# Patient Record
Sex: Female | Born: 1965 | Race: White | Hispanic: No | Marital: Married | Smoking: Never smoker
Health system: Southern US, Community
[De-identification: ages and names within clinical notes are randomized; demographics above are authoritative.]

## PROBLEM LIST (undated history)

## (undated) DIAGNOSIS — R0600 Dyspnea, unspecified: Secondary | ICD-10-CM

## (undated) DIAGNOSIS — T8859XA Other complications of anesthesia, initial encounter: Secondary | ICD-10-CM

## (undated) DIAGNOSIS — I509 Heart failure, unspecified: Secondary | ICD-10-CM

## (undated) DIAGNOSIS — R7303 Prediabetes: Secondary | ICD-10-CM

## (undated) DIAGNOSIS — I10 Essential (primary) hypertension: Secondary | ICD-10-CM

## (undated) DIAGNOSIS — J40 Bronchitis, not specified as acute or chronic: Secondary | ICD-10-CM

## (undated) DIAGNOSIS — F32A Depression, unspecified: Secondary | ICD-10-CM

## (undated) DIAGNOSIS — K219 Gastro-esophageal reflux disease without esophagitis: Secondary | ICD-10-CM

## (undated) DIAGNOSIS — E65 Localized adiposity: Secondary | ICD-10-CM

## (undated) DIAGNOSIS — R197 Diarrhea, unspecified: Secondary | ICD-10-CM

## (undated) DIAGNOSIS — E785 Hyperlipidemia, unspecified: Secondary | ICD-10-CM

## (undated) DIAGNOSIS — E039 Hypothyroidism, unspecified: Secondary | ICD-10-CM

## (undated) DIAGNOSIS — F419 Anxiety disorder, unspecified: Secondary | ICD-10-CM

## (undated) DIAGNOSIS — G479 Sleep disorder, unspecified: Secondary | ICD-10-CM

## (undated) HISTORY — PX: ABDOMINAL HYSTERECTOMY: SHX81

## (undated) HISTORY — PX: APPENDECTOMY: SHX54

## (undated) HISTORY — PX: OTHER SURGICAL HISTORY: SHX169

---

## 2005-03-05 DIAGNOSIS — I509 Heart failure, unspecified: Secondary | ICD-10-CM

## 2005-03-05 HISTORY — DX: Heart failure, unspecified: I50.9

## 2005-05-15 ENCOUNTER — Other Ambulatory Visit: Payer: Self-pay

## 2005-05-16 ENCOUNTER — Inpatient Hospital Stay: Payer: Self-pay | Admitting: Internal Medicine

## 2006-04-02 ENCOUNTER — Ambulatory Visit: Payer: Self-pay | Admitting: Obstetrics and Gynecology

## 2007-03-11 ENCOUNTER — Ambulatory Visit: Payer: Self-pay | Admitting: Nurse Practitioner

## 2007-04-16 ENCOUNTER — Ambulatory Visit: Payer: Self-pay | Admitting: Obstetrics and Gynecology

## 2007-04-22 ENCOUNTER — Ambulatory Visit: Payer: Self-pay | Admitting: Obstetrics and Gynecology

## 2007-04-30 ENCOUNTER — Ambulatory Visit: Payer: Self-pay | Admitting: Obstetrics and Gynecology

## 2007-04-30 ENCOUNTER — Other Ambulatory Visit: Payer: Self-pay

## 2007-05-05 ENCOUNTER — Inpatient Hospital Stay: Payer: Self-pay | Admitting: Obstetrics and Gynecology

## 2007-05-08 ENCOUNTER — Other Ambulatory Visit: Payer: Self-pay

## 2007-05-08 ENCOUNTER — Ambulatory Visit: Payer: Self-pay | Admitting: Obstetrics and Gynecology

## 2007-05-09 ENCOUNTER — Observation Stay: Payer: Self-pay | Admitting: Internal Medicine

## 2007-05-28 ENCOUNTER — Ambulatory Visit: Payer: Self-pay | Admitting: Cardiology

## 2008-01-04 HISTORY — PX: BREAST BIOPSY: SHX20

## 2008-04-27 ENCOUNTER — Ambulatory Visit: Payer: Self-pay | Admitting: Nurse Practitioner

## 2009-04-28 ENCOUNTER — Ambulatory Visit: Payer: Self-pay | Admitting: Nurse Practitioner

## 2010-06-13 ENCOUNTER — Ambulatory Visit: Payer: Self-pay | Admitting: Nurse Practitioner

## 2010-06-30 ENCOUNTER — Ambulatory Visit: Payer: Self-pay | Admitting: Nurse Practitioner

## 2011-06-26 ENCOUNTER — Ambulatory Visit: Payer: Self-pay | Admitting: Nurse Practitioner

## 2012-09-03 ENCOUNTER — Ambulatory Visit: Payer: Self-pay | Admitting: Nurse Practitioner

## 2013-09-28 ENCOUNTER — Ambulatory Visit: Payer: Self-pay | Admitting: Nurse Practitioner

## 2013-11-26 ENCOUNTER — Ambulatory Visit: Payer: Self-pay | Admitting: Family Medicine

## 2014-01-01 ENCOUNTER — Inpatient Hospital Stay: Payer: Self-pay | Admitting: Surgery

## 2014-01-01 LAB — CBC WITH DIFFERENTIAL/PLATELET
BASOS PCT: 0.4 %
Basophil #: 0.1 10*3/uL (ref 0.0–0.1)
EOS PCT: 0 %
Eosinophil #: 0 10*3/uL (ref 0.0–0.7)
HCT: 39.4 % (ref 35.0–47.0)
HGB: 13.2 g/dL (ref 12.0–16.0)
LYMPHS ABS: 2.1 10*3/uL (ref 1.0–3.6)
LYMPHS PCT: 7.7 %
MCH: 28.2 pg (ref 26.0–34.0)
MCHC: 33.4 g/dL (ref 32.0–36.0)
MCV: 84 fL (ref 80–100)
MONOS PCT: 6.2 %
Monocyte #: 1.7 x10 3/mm — ABNORMAL HIGH (ref 0.2–0.9)
NEUTROS ABS: 23.1 10*3/uL — AB (ref 1.4–6.5)
NEUTROS PCT: 85.7 %
Platelet: 284 10*3/uL (ref 150–440)
RBC: 4.66 10*6/uL (ref 3.80–5.20)
RDW: 13.2 % (ref 11.5–14.5)
WBC: 26.9 10*3/uL — AB (ref 3.6–11.0)

## 2014-01-01 LAB — COMPREHENSIVE METABOLIC PANEL
ALK PHOS: 89 U/L
Albumin: 3.7 g/dL (ref 3.4–5.0)
Anion Gap: 11 (ref 7–16)
BILIRUBIN TOTAL: 0.7 mg/dL (ref 0.2–1.0)
BUN: 10 mg/dL (ref 7–18)
CALCIUM: 9.7 mg/dL (ref 8.5–10.1)
Chloride: 103 mmol/L (ref 98–107)
Co2: 26 mmol/L (ref 21–32)
Creatinine: 0.81 mg/dL (ref 0.60–1.30)
EGFR (Non-African Amer.): >60
Glucose: 123 mg/dL — ABNORMAL HIGH (ref 65–99)
Osmolality: 280 (ref 275–301)
Potassium: 3.4 mmol/L — ABNORMAL LOW (ref 3.5–5.1)
SGOT(AST): 22 U/L (ref 15–37)
SGPT (ALT): 49 U/L
Sodium: 140 mmol/L (ref 136–145)
TOTAL PROTEIN: 7.4 g/dL (ref 6.4–8.2)

## 2014-01-01 LAB — URINALYSIS, COMPLETE
BLOOD: NEGATIVE
Bilirubin,UR: NEGATIVE
GLUCOSE, UR: NEGATIVE mg/dL (ref 0–75)
KETONE: NEGATIVE
Nitrite: NEGATIVE
Ph: 5 (ref 4.5–8.0)
Protein: 30
Specific Gravity: 1.031 (ref 1.003–1.030)
Squamous Epithelial: 19
WBC UR: 27 /HPF (ref 0–5)

## 2014-01-03 LAB — CBC WITH DIFFERENTIAL/PLATELET
Basophil #: 0.1 10*3/uL (ref 0.0–0.1)
Basophil %: 0.5 %
EOS ABS: 0.2 10*3/uL (ref 0.0–0.7)
Eosinophil %: 1.8 %
HCT: 31.9 % — AB (ref 35.0–47.0)
HGB: 10.6 g/dL — ABNORMAL LOW (ref 12.0–16.0)
Lymphocyte #: 1.8 10*3/uL (ref 1.0–3.6)
Lymphocyte %: 13.2 %
MCH: 28.4 pg (ref 26.0–34.0)
MCHC: 33.1 g/dL (ref 32.0–36.0)
MCV: 86 fL (ref 80–100)
Monocyte #: 0.7 x10 3/mm (ref 0.2–0.9)
Monocyte %: 5.3 %
NEUTROS ABS: 10.8 10*3/uL — AB (ref 1.4–6.5)
Neutrophil %: 79.2 %
PLATELETS: 186 10*3/uL (ref 150–440)
RBC: 3.72 10*6/uL — AB (ref 3.80–5.20)
RDW: 13.1 % (ref 11.5–14.5)
WBC: 13.6 10*3/uL — AB (ref 3.6–11.0)

## 2014-01-03 LAB — BASIC METABOLIC PANEL
ANION GAP: 6 — AB (ref 7–16)
BUN: 2 mg/dL — AB (ref 7–18)
Calcium, Total: 8.7 mg/dL (ref 8.5–10.1)
Chloride: 105 mmol/L (ref 98–107)
Co2: 31 mmol/L (ref 21–32)
Creatinine: 0.6 mg/dL (ref 0.60–1.30)
EGFR (African American): >60
EGFR (Non-African Amer.): >60
Glucose: 115 mg/dL — ABNORMAL HIGH (ref 65–99)
Osmolality: 280 (ref 275–301)
Potassium: 3.5 mmol/L (ref 3.5–5.1)
Sodium: 142 mmol/L (ref 136–145)

## 2014-01-04 LAB — BASIC METABOLIC PANEL
Anion Gap: 6 — ABNORMAL LOW (ref 7–16)
BUN: 3 mg/dL — ABNORMAL LOW (ref 7–18)
CALCIUM: 9 mg/dL (ref 8.5–10.1)
CHLORIDE: 104 mmol/L (ref 98–107)
CO2: 31 mmol/L (ref 21–32)
Creatinine: 0.68 mg/dL (ref 0.60–1.30)
GLUCOSE: 157 mg/dL — AB (ref 65–99)
Osmolality: 281 (ref 275–301)
POTASSIUM: 3.3 mmol/L — AB (ref 3.5–5.1)
SODIUM: 141 mmol/L (ref 136–145)

## 2014-01-04 LAB — CBC WITH DIFFERENTIAL/PLATELET
BASOS PCT: 0.1 %
Basophil #: 0 10*3/uL (ref 0.0–0.1)
EOS ABS: 0 10*3/uL (ref 0.0–0.7)
EOS PCT: 0 %
HCT: 30.9 % — AB (ref 35.0–47.0)
HGB: 10.4 g/dL — ABNORMAL LOW (ref 12.0–16.0)
Lymphocyte #: 1.3 10*3/uL (ref 1.0–3.6)
Lymphocyte %: 9.3 %
MCH: 28.6 pg (ref 26.0–34.0)
MCHC: 33.5 g/dL (ref 32.0–36.0)
MCV: 85 fL (ref 80–100)
MONO ABS: 0.6 x10 3/mm (ref 0.2–0.9)
Monocyte %: 4.3 %
NEUTROS PCT: 86.3 %
Neutrophil #: 12.3 10*3/uL — ABNORMAL HIGH (ref 1.4–6.5)
Platelet: 224 10*3/uL (ref 150–440)
RBC: 3.62 10*6/uL — ABNORMAL LOW (ref 3.80–5.20)
RDW: 12.9 % (ref 11.5–14.5)
WBC: 14.3 10*3/uL — ABNORMAL HIGH (ref 3.6–11.0)

## 2014-01-04 LAB — MAGNESIUM: MAGNESIUM: 2.1 mg/dL

## 2014-01-12 ENCOUNTER — Emergency Department: Payer: Self-pay | Admitting: Emergency Medicine

## 2014-01-12 LAB — CBC WITH DIFFERENTIAL/PLATELET
BASOS PCT: 1 %
Basophil #: 0.1 10*3/uL (ref 0.0–0.1)
EOS ABS: 0.3 10*3/uL (ref 0.0–0.7)
EOS PCT: 2.3 %
HCT: 41.6 % (ref 35.0–47.0)
HGB: 13.7 g/dL (ref 12.0–16.0)
Lymphocyte #: 3.1 10*3/uL (ref 1.0–3.6)
Lymphocyte %: 22.3 %
MCH: 28.3 pg (ref 26.0–34.0)
MCHC: 32.9 g/dL (ref 32.0–36.0)
MCV: 86 fL (ref 80–100)
MONO ABS: 0.6 x10 3/mm (ref 0.2–0.9)
Monocyte %: 4.1 %
NEUTROS ABS: 9.8 10*3/uL — AB (ref 1.4–6.5)
NEUTROS PCT: 70.3 %
Platelet: 382 10*3/uL (ref 150–440)
RBC: 4.84 10*6/uL (ref 3.80–5.20)
RDW: 13.1 % (ref 11.5–14.5)
WBC: 13.9 10*3/uL — AB (ref 3.6–11.0)

## 2014-01-12 LAB — COMPREHENSIVE METABOLIC PANEL
Albumin: 3.9 g/dL (ref 3.4–5.0)
Alkaline Phosphatase: 93 U/L
Anion Gap: 9 (ref 7–16)
BUN: 16 mg/dL (ref 7–18)
Bilirubin,Total: 0.4 mg/dL (ref 0.2–1.0)
CALCIUM: 9.7 mg/dL (ref 8.5–10.1)
Chloride: 106 mmol/L (ref 98–107)
Co2: 26 mmol/L (ref 21–32)
Creatinine: 0.94 mg/dL (ref 0.60–1.30)
Glucose: 145 mg/dL — ABNORMAL HIGH (ref 65–99)
OSMOLALITY: 285 (ref 275–301)
Potassium: 3.9 mmol/L (ref 3.5–5.1)
SGOT(AST): 53 U/L — ABNORMAL HIGH (ref 15–37)
SGPT (ALT): 104 U/L — ABNORMAL HIGH
Sodium: 141 mmol/L (ref 136–145)
Total Protein: 7.9 g/dL (ref 6.4–8.2)

## 2014-01-12 LAB — URINALYSIS, COMPLETE
BLOOD: NEGATIVE
Bacteria: NONE SEEN
Bilirubin,UR: NEGATIVE
Glucose,UR: NEGATIVE mg/dL (ref 0–75)
Ketone: NEGATIVE
Leukocyte Esterase: NEGATIVE
Nitrite: NEGATIVE
PH: 6 (ref 4.5–8.0)
PROTEIN: NEGATIVE
SPECIFIC GRAVITY: 1.021 (ref 1.003–1.030)
Squamous Epithelial: 1

## 2014-01-12 LAB — CLOSTRIDIUM DIFFICILE(ARMC)

## 2014-06-01 ENCOUNTER — Ambulatory Visit: Payer: Self-pay | Admitting: Nurse Practitioner

## 2014-06-18 ENCOUNTER — Other Ambulatory Visit: Payer: Self-pay | Admitting: Nurse Practitioner

## 2014-06-18 DIAGNOSIS — Z1231 Encounter for screening mammogram for malignant neoplasm of breast: Secondary | ICD-10-CM

## 2014-06-26 NOTE — Op Note (Signed)
PATIENT NAME:  Judy Burnett, LIBRIZZI MR#:  [id number] DATE OF BIRTH:  Sep 22, 1965  DATE OF PROCEDURE:  01/01/2014  PREOPERATIVE DIAGNOSIS: Acute appendicitis.   POSTOPERATIVE DIAGNOSES:  1.  Acute appendicitis, gangrenous and perforated.  2.  Intra-abdominal adhesions.   OPERATION PERFORMED: Laparoscopic lysis of adhesions and appendectomy.   SURGEON: Consuela Mimes, MD  ANESTHESIA: General.   PROCEDURE IN DETAIL: The patient was placed supine on the Operating Room table and prepped and draped in the usual sterile fashion. A Hassan cannula was introduced amidst horizontal mattress sutures of 0 Vicryl in the supraumbilical midline and a 15 mmHg CO2 pneumoperitoneum was created. The patient was noted to have multiple adhesions along the anterior abdominal wall, particularly in the midline between the umbilicus and the pelvis (from her previous hysterectomy). A 5 mm trocar was placed in the right lower quadrant and these adhesions were taken down mostly with the Harmonic scalpel, but there were 2 areas of terminal ileum that were adherent to the anterior abdominal wall and tedious sharp dissection was performed here taking care not to injure the full thickness of the intestinal wall. There were two 3 to 4 mm areas of deserosalization but the mucosa remained intact for both of these. Both of these areas were inspected very carefully with the laparoscope and a laparoscopic Kitner dissector and the suction irrigation device. Once these adhesions were taken down another 5 mm trocar was placed in the suprapubic midline and the appendectomy was undertaken. The appendix was very swollen and gangrenous and had perforated and there was a small amount of localized pus as well as a very small appendicolith present. This was all cleaned up with the suction irrigator and the mesoappendix was taken down with the Harmonic scalpel and the appendectomy was performed just where the appendix met the cecum at its base with  the Endo GIA stapling device. The appendix was placed in an Endo Catch bag and extracted from the abdomen via the supraumbilical port. The right lower quadrant was irrigated with copious amounts of warm normal saline and this was suctioned clear from the area as well as from the pelvis, and then the omentum (which had been adherent to the anterior abdominal wall) was draped over top of the operative area including the loop of terminal ileum. The peritoneum was desufflated and decannulated and the linea alba was closed with a single 0 PDS suture and the previously placed Vicryls were tied one to another. All 3 skin sites were closed with subcuticular 5-0 Monocryl and suture strips. The patient tolerated the procedure well. There were no complications.     ____________________________ Consuela Mimes, MD wfm:ts D: 01/01/2014 23:02:07 ET T: 01/02/2014 03:15:12 ET JOB#: [id number]  cc: Consuela Mimes, MD, <Dictator> Consuela Mimes MD ELECTRONICALLY SIGNED 01/03/2014 3:07

## 2014-06-26 NOTE — Discharge Summary (Signed)
PATIENT NAME:  Judy Burnett, Judy Burnett MR#:  [id number] DATE OF BIRTH:  1966-01-05  DATE OF ADMISSION:  01/01/2014 DATE OF DISCHARGE:  01/04/2014  PRINCIPLE DIAGNOSIS: Gangrenous perforated acute appendicitis.   OTHER DIAGNOSES: Hypertension, dyslipidemia, anxiety/depression, status post hysterectomy.   PRINCIPLE PROCEDURES PERFORMED DURING THIS ADMISSION: On 10/30 were laparoscopic lysis of adhesions and appendectomy.   HOSPITAL COURSE: The patient underwent the above-mentioned procedure for the above-mentioned diagnosis, and she was discharged home on November 2 after she was tolerating a diet and afebrile. Her medications at the time of discharge were unchanged from her prehospital medication regimen with the exception of Norco 5/325 mg 1 to 2 q. 4-6h. Burnett.r.n. pain. She was asked to make an appointment to see me in a week or 2 and call the office in the interim for any problems.    ____________________________ Claude MangesWilliam F. Lakeena Downie, MD wfm:jh D: 01/11/2014 11:52:11 ET T: 01/11/2014 13:09:00 ET JOB#: [id number]  cc: Claude MangesWilliam F. Ila Landowski, MD, <Dictator> Claude MangesWILLIAM F Zyrus Hetland MD ELECTRONICALLY SIGNED 01/15/2014 9:20

## 2014-06-26 NOTE — H&P (Signed)
History of Present Illness 5 yof who started having nausea, vomiting and RLQ abdominal pain yesterday and through the night. She was unable to sleep. The pain got very devere at about 2:30 AM, and was associated with chills. Had a RUQ U/S a few weeks ago which apparently was negative for GSs. No GU Sx.   Past Medical Health Hypertension, Other, Dyslipidemia   Code Status Full Code   Past Med/Surgical Hx:  Anxiety:   Depression:   Hysterectomy - Total:   ALLERGIES:  No Known Allergies:   HOME MEDICATIONS: Medication Instructions Status  Lexapro 10 mg oral tablet 1 tab(s) orally once a day Active  hydrochlorothiazide 25 mg oral tablet 1 tab(s) orally once a day Active  Crestor 5 mg oral tablet 1 tab(s) orally once a day (in the morning) Active  multivitamin 1 tab(s) orally once a day Active  omeprazole 20 mg oral delayed release capsule 1 cap(s) orally once a day, As Needed - for Indigestion, Heartburn Active   Family and Social History:  Family History Non-Contributory   Social History negative tobacco, negative ETOH, married, lives with husband and two sons (25 and 101); HS history Software engineer of [employer]   Review of Systems:  Fever/Chills Yes   Cough No   Sputum No   Abdominal Pain Yes   Diarrhea No   Constipation No   Nausea/Vomiting Yes   SOB/DOE No   Chest Pain No   Dysuria No   Tolerating PT Yes   Tolerating Diet No  Nauseated  Vomiting   Medications/Allergies Reviewed Medications/Allergies reviewed   Physical Exam:  GEN well developed, well nourished, no acute distress, obese   HEENT pink conjunctivae, PERRL, hearing intact to voice, moist oral mucosa, Oropharynx clear   NECK supple  trachea midline   RESP normal resp effort  clear BS  no use of accessory muscles   CARD regular rate  no murmur  no thrills  no JVD  no Rub   ABD positive tenderness  nondistended, exquisite rebound tenderness with guarding in RLQ   LYMPH negative  neck   EXTR negative cyanosis/clubbing, negative edema   SKIN normal to palpation, No rashes, No ulcers, skin turgor good   NEURO cranial nerves intact, negative tremor, follows commands, motor/sensory function intact   PSYCH alert, A+O to time, place, person, good insight   Lab Results: Hepatic:  30-Oct-15 15:07   Bilirubin, Total 0.7  Alkaline Phosphatase 89 (46-116 NOTE: New Reference Range 09/22/13)  SGPT (ALT) 49 (14-63 NOTE: New Reference Range 09/22/13)  SGOT (AST) 22  Total Protein, Serum 7.4  Albumin, Serum 3.7  Routine Chem:  30-Oct-15 15:07   Glucose, Serum  123  BUN 10  Creatinine (comp) 0.81  Sodium, Serum 140  Potassium, Serum  3.4  Chloride, Serum 103  CO2, Serum 26  Calcium (Total), Serum 9.7  Osmolality (calc) 280  eGFR (African American) >60  eGFR (Non-African American) >60 (eGFR values <29m/min/1.73 m2 may be an indication of chronic kidney disease (CKD). Calculated eGFR, using the MRDR Study equation, is useful in  patients with stable renal function. The eGFR calculation will not be reliable in acutely ill patients when serum creatinine is changing rapidly. It is not useful in patients on dialysis. The eGFR calculation may not be applicable to patients at the low and high extremes of body sizes, pregnant women, and vegetarians.)  Anion Gap 11  Routine UA:  30-Oct-15 15:07   Color (UA) Yellow  Clarity (UA) Turbid  Glucose (UA) Negative  Bilirubin (UA) Negative  Ketones (UA) Negative  Specific Gravity (UA) 1.031  Blood (UA) Negative  pH (UA) 5.0  Protein (UA) 30 mg/dL  Nitrite (UA) Negative  Leukocyte Esterase (UA) 1+ (Result(s) reported on 01 Jan 2014 at 04:54PM.)  RBC (UA) 10 /HPF  WBC (UA) 27 /HPF  Bacteria (UA) TRACE  Epithelial Cells (UA) 19 /HPF  Mucous (UA) PRESENT  Amorphous Crystal (UA) PRESENT (Result(s) reported on 01 Jan 2014 at 04:54PM.)  Routine Hem:  30-Oct-15 15:07   WBC (CBC)  26.9  RBC (CBC) 4.66  Hemoglobin  (CBC) 13.2  Hematocrit (CBC) 39.4  Platelet Count (CBC) 284  MCV 84  MCH 28.2  MCHC 33.4  RDW 13.2  Neutrophil % 85.7  Lymphocyte % 7.7  Monocyte % 6.2  Eosinophil % 0.0  Basophil % 0.4  Neutrophil #  23.1  Lymphocyte # 2.1  Monocyte #  1.7  Eosinophil # 0.0  Basophil # 0.1 (Result(s) reported on 01 Jan 2014 at 03:37PM.)   Radiology Results: XRay:    30-Oct-15 17:25, Chest 1 View AP or PA  Chest 1 View AP or PA  REASON FOR EXAM:    cough  COMMENTS:   May transport without cardiac monitor    PROCEDURE: DXR - DXR CHEST 1 VIEWAP OR PA  - Jan 01 2014  5:25PM     CLINICAL DATA:  Right-sided abdominal pain.  Vomiting.  Diarrhea.    EXAM:  CHEST - 1 VIEW    COMPARISON:  None.    FINDINGS:  The heart size and mediastinal contours are within normal limits.  Both lungs are clear. The visualized skeletal structures are  unremarkable.     IMPRESSION:  No active disease.      Electronically Signed    By: Earle Gell M.D.    On: 01/01/2014 17:27         Verified By: Marlaine Hind, M.D.,  [place]:  PACS Image    30-Oct-15 18:18, CT Abdomen and Pelvis With Contrast  PACS Image  CT:  CT Abdomen and Pelvis With Contrast  REASON FOR EXAM:    (1) rlq pain, tender, fever, wbc 26; (2) rlq pain,   tender, fever, wbc 26  COMMENTS:   May transport without cardiac monitor    PROCEDURE: CT  - CT ABDOMEN / PELVIS  W  - Jan 01 2014  6:18PM     CLINICAL DATA:  Right lower quadrant pain .    EXAM:  CT ABDOMEN AND PELVIS WITH CONTRAST    TECHNIQUE:  Multidetector CT imaging of the abdomen and pelvis was performed  using the standard protocol following bolus administration of  intravenous contrast.  CONTRAST:  100 cc Isovue-300.    COMPARISON:  None.    FINDINGS:  Diffuse fatty infiltration of the liver. Spleen is normal. Pancreas  is normal. No biliary distention. Gallbladder is nondistended.    Adrenals are normal. Kidneys are normal. Bladder is nondistended.  No  pelvic mass.    No significant adenopathy. Abdominal aorta is normal in appearance.  Visceral vessels and portal vein patent.    The appendix is distended 1.7 cm. Probable appendicoliths are  present. Severe periappendiceal edema noted. These findings are  consistent with appendicitis. No evidence of bowel obstruction. No  free air. Stomach is nondistended. No mesenteric mass. No  significant hernia.    Heart size normal. Lung bases are clear. No acute bony abnormality.  IMPRESSION:  Severe changes of appendicitis. These results were called by  telephone at the time of interpretation on 01/01/2014 at 6:52 pm to  Dr. Carrie Mew , who verbally acknowledged these results.      Electronically Signed    By: Marcello Moores  Register    On: 01/01/2014 18:54        Verified By: Osa Craver, M.D., MD    Assessment/Admission Diagnosis Acute appendicitis   Plan IVF, IV ABx, Lap appy Likely operative and postoperative course, risks, recovery discussed and pt agrees to proceed.   Electronic Signatures: Consuela Mimes (MD)  (Signed 30-Oct-15 20:11)  Authored: CHIEF COMPLAINT and HISTORY, PAST MEDICAL/SURGIAL HISTORY, ALLERGIES, HOME MEDICATIONS, FAMILY AND SOCIAL HISTORY, REVIEW OF SYSTEMS, PHYSICAL EXAM, LABS, Radiology, ASSESSMENT AND PLAN   Last Updated: 30-Oct-15 20:11 by Consuela Mimes (MD)

## 2014-06-26 NOTE — Consult Note (Signed)
PATIENT NAME:  Judy AmesJENKINS, Malaisha P MR#:  [id number] DATE OF BIRTH:  20-May-1965  DATE OF CONSULTATION:  01/03/2014  REFERRING PHYSICIAN:  Loraine LericheMark A. Egbert GaribaldiBird, MD CONSULTING PHYSICIAN:  Duane LopeJeffrey D. Judithann SheenSparks, MD  FAMILY PHYSICIAN: Nonlocal.   REASON FOR CONSULTATION: Shortness of breath with hypoxia.   HISTORY OF PRESENT ILLNESS: The patient is a 49 year old female with a significant history of hypertension, hyperlipidemia and anxiety/depression who presented to the Emergency Room with abdominal pain and was found to have acute appendicitis. She was admitted and underwent appendectomy. Yesterday afternoon, the patient developed cough, wheezing and shortness of breath. She became mildly hypoxic. Consultation was subsequently requested. Denies any history of asthma or COPD. She is not a smoker. No cardiac history.   PAST MEDICAL HISTORY:  1.  Benign hypertension.  2.  Hyperlipidemia.  3.  GE reflux disease.  4.  Anxiety/depression.  5.  Status post hysterectomy.   MEDICATIONS:  1.  Omeprazole 20 mg p.o. daily.  2.  Lexapro 10 mg p.o. daily.  3.  Hydrochlorothiazide 25 mg p.o. daily.  4.  Crestor 5 mg p.o. daily.   ALLERGIES: No known drug allergies.   SOCIAL HISTORY: Negative for alcohol or tobacco abuse.   FAMILY HISTORY: Positive for hypertension, diabetes, stroke, but negative for coronary artery disease.   REVIEW OF SYSTEMS: CONSTITUTIONAL: No fever or change in weight.  EYES: No blurred or double vision. No glaucoma.  EARS, NOSE AND THROAT: No tinnitus or hearing loss. No nasal discharge or bleeding. Some difficulty swallowing post intubation for surgery.  GASTROINTESTINAL: No nausea, vomiting or diarrhea. Some abdominal pain postoperatively.  GENITOURINARY: No dysuria or hematuria. No incontinence.  ENDOCRINE: No polyuria or polydipsia. No heat or cold intolerance.  CARDIOVASCULAR: Denies chest pain or palpitations. No orthopnea.  PULMONARY: Some cough. Denies hemoptysis. No painful  respiration.  HEMATOLOGIC: The patient denies anemia, bruising or easy bleeding.  LYMPHATIC: No swollen glands.  MUSCULOSKELETAL: The patient denies pain in her neck, back, shoulders, knees, hips. No gout.  NEUROLOGIC: No numbness or migraines. Denies stroke or seizures.  PSYCHOLOGICAL: The patient denies depression, although she does admit to some anxiety.   PHYSICAL EXAMINATION:  GENERAL: The patient is in no acute distress.  VITAL SIGNS: Currently remarkable for a blood pressure of 135/82, heart rate 86, respiratory rate of 22, temperature 98.6, saturation 95% on 2 liters.  HEENT: Normocephalic, atraumatic. Pupils show to be equal, round and reactive to light and accommodation. Extraocular movements are intact. Sclerae are not icteric. Conjunctivae are clear. Oropharynx is clear. NECK: Supple without JVD. No adenopathy or thyromegaly is noted.  LUNGS: Reveal mild wheezing in the upper airways. There are some scattered rhonchi. No dullness or rales. Respiratory effort is normal.  CARDIAC: Regular rate and rhythm with a normal S1, S2. No significant rubs, murmurs or gallops. PMI is nondisplaced. Chest wall is nontender.  ABDOMEN: Mildly tender but soft. Normoactive bowel sounds. No organomegaly or masses were appreciated. No hernias or bruits were noted.  EXTREMITIES: Without clubbing, cyanosis or edema. Pulses were 2+ bilaterally.  SKIN: Warm and dry without rash or lesions.  NEUROLOGIC: Revealed cranial nerves II-XII grossly intact. Deep tendon reflexes were symmetric. Motor and sensory examinations nonfocal.  PSYCHIATRIC: Revealed a patient who is alert and oriented to person, place and time. She was cooperative and used good judgment.   LABORATORY DATA: White count was 13.6 with a hemoglobin of 10.6. Glucose 115 with a BUN of 2, creatinine of 0.60. GFR of greater than  60.   ASSESSMENT:  1.  Postoperative bronchospasm, questionably related to inflammation post intubation.  2.  Benign  hypertension.  3.  Hyperlipidemia.  4.  Anxiety/depression.  5.  Status post appendectomy.   PLAN: CT scan of the chest to rule out PE has been ordered. We will give a one-time dose of IV Solu-Medrol. We will begin SVNs q. 4 hours while awake and Symbicort 2 puffs b.i.d. Wean oxygen as tolerated. Follow up laboratories in the morning.   Thank you for the consultation. We will continue to follow this patient with you while in the hospital. Please call if questions arise.   TOTAL TIME SPENT ON THIS PATIENT: Forty-five minutes.    ____________________________ Duane Lope Judithann Sheen, MD jds:TT D: 01/03/2014 11:29:26 ET T: 01/03/2014 16:15:22 ET JOB#: [id number]  cc: Duane Lope. Judithann Sheen, MD, <Dictator> Jonnae Fonseca Rodena Medin MD ELECTRONICALLY SIGNED 01/03/2014 17:14

## 2014-06-26 NOTE — Consult Note (Signed)
Chief Complaint:  Subjective/Chief Complaint Pt post-op from appy now with SOB, cough, wheezing, and mild hypoxia. No fever. No hx of COPD/asthma or cardiac dz.   VITAL SIGNS/ANCILLARY NOTES: **Vital Signs.:   01-Nov-15 04:25  Temperature Temperature (F) 97.8  Pulse Pulse 79  Respirations Respirations 22  Systolic BP Systolic BP 892  Diastolic BP (mmHg) Diastolic BP (mmHg) 75  Pulse Ox % Pulse Ox % 97  Oxygen Delivery 2L    06:20  Temperature Temperature (F) 98.5  Pulse Pulse 74  Systolic BP Systolic BP 97  Diastolic BP (mmHg) Diastolic BP (mmHg) 60  Pulse Ox % Pulse Ox % 97    07:49  Temperature Temperature (F) 98.6  Pulse Pulse 86  Respirations Respirations 22  Systolic BP Systolic BP 119  Diastolic BP (mmHg) Diastolic BP (mmHg) 82  Pulse Ox % Pulse Ox % 95  Oxygen Delivery 2L   Brief Assessment:  GEN no acute distress   Cardiac Regular   Respiratory wheezing  rhonchi   Gastrointestinal details normal Soft  Bowel sounds normal   EXTR negative edema   Lab Results: Routine Chem:  30-Oct-15 15:07   Glucose, Serum  123  BUN 10  Creatinine (comp) 0.81  Sodium, Serum 140  Potassium, Serum  3.4  Chloride, Serum 103  CO2, Serum 26  Anion Gap 11  eGFR (Non-African American) >60 (eGFR values <48m/min/1.73 m2 may be an indication of chronic kidney disease (CKD). Calculated eGFR, using the MRDR Study equation, is useful in  patients with stable renal function. The eGFR calculation will not be reliable in acutely ill patients when serum creatinine is changing rapidly. It is not useful in patients on dialysis. The eGFR calculation may not be applicable to patients at the low and high extremes of body sizes, pregnant women, and vegetarians.)  01-Nov-15 08:29   Glucose, Serum  115  BUN  2  Creatinine (comp) 0.60  Sodium, Serum 142  Potassium, Serum 3.5  Chloride, Serum 105  CO2, Serum 31  Anion Gap  6  eGFR (Non-African American) >60 (eGFR values  <669mmin/1.73 m2 may be an indication of chronic kidney disease (CKD). Calculated eGFR, using the MRDR Study equation, is useful in  patients with stable renal function. The eGFR calculation will not be reliable in acutely ill patients when serum creatinine is changing rapidly. It is not useful in patients on dialysis. The eGFR calculation may not be applicable to patients at the low and high extremes of body sizes, pregnant women, and vegetarians.)  Routine Hem:  30-Oct-15 15:07   WBC (CBC)  26.9  Hemoglobin (CBC) 13.2  Platelet Count (CBC) 284  01-Nov-15 08:29   WBC (CBC)  13.6  Hemoglobin (CBC)  10.6  Platelet Count (CBC) 186   Radiology Results: XRay:    30-Oct-15 17:25, Chest 1 View AP or PA  Chest 1 View AP or PA   REASON FOR EXAM:    cough  COMMENTS:   May transport without cardiac monitor    PROCEDURE: DXR - DXR CHEST 1 VIEWAP OR PA  - Jan 01 2014  5:25PM     CLINICAL DATA:  Right-sided abdominal pain.  Vomiting.  Diarrhea.    EXAM:  CHEST - 1 VIEW    COMPARISON:  None.    FINDINGS:  The heart size and mediastinal contours are within normal limits.  Both lungs are clear. The visualized skeletal structures are  unremarkable.     IMPRESSION:  No active disease.  Electronically Signed    By: Earle Gell M.D.    On: 01/01/2014 17:27         Verified By: Marlaine Hind, M.D.,   Assessment/Plan:  Assessment/Plan:  Plan Will use SVN's q4h WA and add Symbicort. 1 time dose of IV steroids. CT chest pending. Wean O2 as toelrated. Will follow with you.   Electronic Signatures: Idelle Crouch (MD)  (Signed [id number] 11:23)  Authored: Chief Complaint, VITAL SIGNS/ANCILLARY NOTES, Brief Assessment, Lab Results, Radiology Results, Assessment/Plan   Last Updated: 01-Nov-15 11:23 by Idelle Crouch (MD)

## 2014-06-26 NOTE — Consult Note (Signed)
PATIENT NAME:  Judy Burnett, Judy Burnett MR#:  [id number] DATE OF BIRTH:  [birth year]  DATE OF CONSULTATION:  01/12/2014  REFERRING PHYSICIAN:   CONSULTING PHYSICIAN:  Cristal Deerhristopher A. Cullin Dishman, MD  ATTENDING PHYSICIAN:  Ida Roguehristopher Mersades Barbaro, MD.    REASON FOR CONSULTATION: Drainage from wound, diarrhea, and postoperative abdominal pain.   HISTORY OF PRESENT ILLNESS: [name] is a pleasant 49 year old female with history of hypertension, hyperlipidemia, who recently had a laparoscopic appendectomy and drainage of intra-abdominal abscess performed on October 30. She was discharged on November 2 taking good Burnett.o. with good Burnett.o. pain control, voiding and stooling without difficulties. She presents with drainage from her abdominal incision she says since the last three days. She has had diarrhea. She also complains of having a kind of diffuse pain, does not feel like she is returning back to baseline. Otherwise no fevers, chills, night sweats, shortness of breath, cough, chest pain, nausea, vomiting, constipation, dysuria, or hematuria.   HOME MEDICATIONS:  Omeprazole 20 mg Burnett.o. daily, multivitamin 1 tab Burnett.o. daily, Lexapro 10 mg Burnett.o. daily, hydrochlorothiazide 125 mg Burnett.o. daily, Crestor 5 mg Burnett.o. daily, Norco 1 tab Burnett.o. q. 4 hours Burnett.r.n. pain.   ALLERGIES: No known drug allergies.   FAMILY HISTORY: Noncontributory.   SOCIAL HISTORY: Denies tobacco, alcohol. Is a high Engineer, siteschool teacher.   REVIEW OF SYSTEMS: A 12 point review of systems review of systems obtained, pertinent positives and negatives as above.   PHYSICAL EXAMINATION:  VITAL SIGNS: Temperature 97.9, pulse 99, blood pressure 121/77, respirations 18,  97% on room air.  GENERAL: No acute distress. Alert and oriented x 2.  HEAD: Normocephalic, atraumatic.  EYES: No scleral icterus. No conjunctivitis.  FACE: No obvious facial trauma. Normal external nose. Normal external ears.  CHEST: Lungs clear to auscultation. Moving air well.  HEART:  Regular rate and rhythm. No murmurs, rubs, or gallops.  ABDOMEN: Soft, diffusely tender, has midline incision which is draining some sanguinopurulent drainage, but appears to be draining no obvious abscess.  EXTREMITIES: Moves all extremities well. Strength 5 out of 5.  NEUROLOGIC: Cranial nerves II through XII grossly intact. Sensation intact in all 4 extremities.   LABORATORY AND RADIOLOGICAL DATA: Pending.   IMAGING: A CT scan pending.   ASSESSMENT AND PLAN: [name] is a pleasant 49 year old status post laparoscopic appendectomy for ruptured and gangrenous appendicitis with abscess.  Due to her diffuse pain would consider a CT scan if laboratories are abnormal, also would recommend antibiotics for draining wound. In addition would consider testing for Clostridium difficile due to the fact that had diarrhea for a few days. We will follow up on CT scan results.     ____________________________ Si Raiderhristopher A. Truett Mcfarlan, MD cal:bu D: 01/12/2014 16:06:46 ET T: 01/12/2014 17:32:34 ET JOB#: [id number]  cc: Cristal Deerhristopher A. Claryce Friel, MD, <Dictator> Jarvis NewcomerHRISTOPHER A Laird Runnion MD ELECTRONICALLY SIGNED 01/17/2014 19:33

## 2014-06-28 LAB — SURGICAL PATHOLOGY

## 2014-09-30 ENCOUNTER — Ambulatory Visit
Admission: RE | Admit: 2014-09-30 | Discharge: 2014-09-30 | Disposition: A | Discharge status: Home / Self Care | Payer: BC Managed Care – PPO | Source: Ambulatory Visit | Attending: Nurse Practitioner | Admitting: Nurse Practitioner

## 2014-09-30 DIAGNOSIS — Z1231 Encounter for screening mammogram for malignant neoplasm of breast: Secondary | ICD-10-CM | POA: Insufficient documentation

## 2014-10-01 ENCOUNTER — Other Ambulatory Visit: Payer: Self-pay | Admitting: Nurse Practitioner

## 2014-10-01 DIAGNOSIS — E049 Nontoxic goiter, unspecified: Secondary | ICD-10-CM

## 2014-10-06 ENCOUNTER — Ambulatory Visit
Admission: RE | Admit: 2014-10-06 | Discharge: 2014-10-06 | Disposition: A | Discharge status: Home / Self Care | Payer: BC Managed Care – PPO | Source: Ambulatory Visit | Attending: Nurse Practitioner | Admitting: Nurse Practitioner

## 2014-10-06 DIAGNOSIS — E049 Nontoxic goiter, unspecified: Secondary | ICD-10-CM

## 2014-10-08 ENCOUNTER — Other Ambulatory Visit: Payer: Self-pay

## 2014-10-08 ENCOUNTER — Emergency Department
Admission: EM | Admit: 2014-10-08 | Discharge: 2014-10-08 | Disposition: A | Discharge status: Home / Self Care | Payer: BC Managed Care – PPO | Attending: Emergency Medicine | Admitting: Emergency Medicine

## 2014-10-08 ENCOUNTER — Emergency Department: Payer: BC Managed Care – PPO

## 2014-10-08 ENCOUNTER — Encounter: Payer: Self-pay | Admitting: Emergency Medicine

## 2014-10-08 DIAGNOSIS — R079 Chest pain, unspecified: Secondary | ICD-10-CM | POA: Diagnosis present

## 2014-10-08 DIAGNOSIS — R1013 Epigastric pain: Secondary | ICD-10-CM | POA: Diagnosis not present

## 2014-10-08 DIAGNOSIS — Z79899 Other long term (current) drug therapy: Secondary | ICD-10-CM | POA: Diagnosis not present

## 2014-10-08 DIAGNOSIS — R0602 Shortness of breath: Secondary | ICD-10-CM | POA: Diagnosis not present

## 2014-10-08 HISTORY — DX: Heart failure, unspecified: I50.9

## 2014-10-08 LAB — BASIC METABOLIC PANEL
Anion gap: 10 (ref 5–15)
BUN: 14 mg/dL (ref 6–20)
CALCIUM: 9.9 mg/dL (ref 8.9–10.3)
CO2: 27 mmol/L (ref 22–32)
Chloride: 99 mmol/L — ABNORMAL LOW (ref 101–111)
Creatinine, Ser: 0.58 mg/dL (ref 0.44–1.00)
GFR calc Af Amer: >60 mL/min (ref >60)
GLUCOSE: 119 mg/dL — AB (ref 65–99)
POTASSIUM: 3.2 mmol/L — AB (ref 3.5–5.1)
SODIUM: 136 mmol/L (ref 135–145)

## 2014-10-08 LAB — TROPONIN I: Troponin I: <0.03 ng/mL (ref <0.031)

## 2014-10-08 LAB — CBC
HCT: 40 % (ref 35.0–47.0)
Hemoglobin: 14.1 g/dL (ref 12.0–16.0)
MCH: 29.1 pg (ref 26.0–34.0)
MCHC: 35.3 g/dL (ref 32.0–36.0)
MCV: 82.3 fL (ref 80.0–100.0)
Platelets: 230 10*3/uL (ref 150–440)
RBC: 4.86 MIL/uL (ref 3.80–5.20)
RDW: 13 % (ref 11.5–14.5)
WBC: 7.6 10*3/uL (ref 3.6–11.0)

## 2014-10-08 LAB — URINALYSIS COMPLETE WITH MICROSCOPIC (ARMC ONLY)
Bacteria, UA: NONE SEEN
Bilirubin Urine: NEGATIVE
Glucose, UA: NEGATIVE mg/dL
Hgb urine dipstick: NEGATIVE
Ketones, ur: NEGATIVE mg/dL
Leukocytes, UA: NEGATIVE
Nitrite: NEGATIVE
Protein, ur: NEGATIVE mg/dL
Specific Gravity, Urine: 1.018 (ref 1.005–1.030)
Squamous Epithelial / HPF: NONE SEEN
pH: 6 (ref 5.0–8.0)

## 2014-10-08 LAB — HEPATIC FUNCTION PANEL
ALT: 47 U/L (ref 14–54)
AST: 37 U/L (ref 15–41)
Albumin: 4.5 g/dL (ref 3.5–5.0)
Alkaline Phosphatase: 85 U/L (ref 38–126)
TOTAL PROTEIN: 7.6 g/dL (ref 6.5–8.1)
Total Bilirubin: 0.4 mg/dL (ref 0.3–1.2)

## 2014-10-08 LAB — LIPASE, BLOOD: Lipase: 37 U/L (ref 22–51)

## 2014-10-08 MED ORDER — ONDANSETRON 4 MG PO TBDP
4.0000 mg | ORAL_TABLET | Freq: Once | ORAL | Status: AC
  Administered 2014-10-08: 4 mg via ORAL
  Filled 2014-10-08: qty 1

## 2014-10-08 MED ORDER — GI COCKTAIL ~~LOC~~
30.0000 mL | Freq: Once | ORAL | Status: AC
  Administered 2014-10-08: 30 mL via ORAL
  Filled 2014-10-08: qty 30

## 2014-10-08 NOTE — ED Notes (Signed)
Pt made aware of need for urine sample.  

## 2014-10-08 NOTE — ED Notes (Signed)
Pt reports that she was nauseous yesterday and that she began to feel sharp pains in her chest area. She also states that she was "unusually sweaty" last night and that she feels "full" and like she has indigestion. Pt reports that her pain has settled into the area between her shoulder blades in her upper back. She is alert & oriented with warm, dry skin. NAD noted.

## 2014-10-08 NOTE — ED Notes (Signed)
Patient to ER for chest pain that began yesterday. Reports episode of nausea with diaphoresis yesterday. States she feels like she "has indigestion", reports back pain today.

## 2014-10-08 NOTE — ED Provider Notes (Signed)
[facility] Emergency Department Provider Note   ____________________________________________  Time seen: 4 PM I have reviewed the triage vital signs and the triage nursing note.  HISTORY  Chief Complaint Chest Pain   Historian Patient  HPI Judy Burnett is a 49 y.o. female who is a 24 hours of midepigastric pain which goes through to her back. She's also had a little bit shortness of breath, and a little bit of indigestion. She is unsure if this may be related to her gallbladder. She's had similar symptoms in the past that her doctor thought it might be related to Doppler, however she had a negative ultrasound for any gallstones and a negative HIDA scan in the past. She's had no vomiting or diarrhea. Chest discomfort at the sternal area. Due to her belly feels "full."No recent fevers or coughing. No constipation.    Past Medical History  Diagnosis Date  . CHF (congestive heart failure)     There are no active problems to display for this patient.   Past Surgical History  Procedure Laterality Date  . Breast biopsy Left 01/2008    neg  . Abdominal hysterectomy    . Appendectomy      Current Outpatient Rx  Name  Route  Sig  Dispense  Refill  . citalopram (CELEXA) 40 MG tablet   Oral   Take 40 mg by mouth at bedtime.         . hydrochlorothiazide (HYDRODIURIL) 25 MG tablet   Oral   Take 25 mg by mouth at bedtime.         Marland Kitchen levothyroxine (SYNTHROID, LEVOTHROID) 50 MCG tablet   Oral   Take 50 mcg by mouth daily before breakfast.         . Multiple Vitamins-Minerals (MULTIVITAMIN PO)   Oral   Take 1 tablet by mouth at bedtime.         . rosuvastatin (CRESTOR) 20 MG tablet   Oral   Take 20 mg by mouth at bedtime.           Allergies Review of patient's allergies indicates no known allergies.  No family history on file.  Social History History  Substance Use Topics  . Smoking status: Never Smoker   . Smokeless tobacco:  Not on file  . Alcohol Use: No    Review of Systems  Constitutional: Negative for fever. Eyes: Negative for visual changes. ENT: Negative for sore throat. Cardiovascular: Negative for chest pain. Respiratory: Negative for cough Gastrointestinal: Negative for vomiting and diarrhea. Genitourinary: Negative for dysuria. Musculoskeletal: Negative for extremity pain.. Skin: Negative for rash. Neurological: Negative for headaches, focal weakness or numbness. 10 point Review of Systems otherwise negative ____________________________________________   PHYSICAL EXAM:  VITAL SIGNS: ED Triage Vitals  Enc Vitals Group     BP 10/08/14 1414 128/73 mmHg     Pulse Rate 10/08/14 1414 86     Resp 10/08/14 1414 18     Temp 10/08/14 1414 98.8 F (37.1 C)     Temp Source 10/08/14 1414 Oral     SpO2 10/08/14 1414 99 %     Weight 10/08/14 1414 190 lb (86.183 kg)     Height 10/08/14 1414 5\' 4"  (1.626 m)     Head Cir --      Peak Flow --      Pain Score 10/08/14 1415 5     Pain Loc --      Pain Edu? --  Excl. in GC? --      Constitutional: Alert and oriented. Well appearing and in no distress. Eyes: Conjunctivae are normal. PERRL. Normal extraocular movements. ENT   Head: Normocephalic and atraumatic.   Nose: No congestion/rhinnorhea.   Mouth/Throat: Mucous membranes are moist.   Neck: No stridor. Cardiovascular/Chest: Normal rate, regular rhythm.  No murmurs, rubs, or gallops. Respiratory: Normal respiratory effort without tachypnea nor retractions. Breath sounds are clear and equal bilaterally. No wheezes/rales/rhonchi. Gastrointestinal: Soft. No distention, no guarding, no rebound. Mild tenderness in the epigastrium.  Genitourinary/rectal:Deferred Musculoskeletal: Nontender with normal range of motion in all extremities. No joint effusions.  No lower extremity tenderness nor edema. Neurologic:  Normal speech and language. No gross or focal neurologic deficits are  appreciated. Skin:  Skin is warm, dry and intact. No rash noted. Psychiatric: Mood and affect are normal. Speech and behavior are normal. Patient exhibits appropriate insight and judgment.  ____________________________________________   EKG I, Governor Rooks, MD, the attending physician have personally viewed and interpreted all ECGs.  77 bpm. Normal sinus rhythm. Narrow QRS. Normal axis. Nonspecific T wave. ____________________________________________  LABS (pertinent positives/negatives)  Metabolic panel significant only for potassium 3.2 otherwise no significant abnormality White blood cell count 7.6, hemoglobin 41 Troponin less than 0.03 Hepatic function panel within normal limits Lipase 37  ____________________________________________  RADIOLOGY All Xrays were viewed by me. Imaging interpreted by Radiologist.  Chest portable: No edema or consolidation. __________________________________________  PROCEDURES  Procedure(s) performed: None Critical Care performed: None  ____________________________________________   ED COURSE / ASSESSMENT AND PLAN  CONSULTATIONS: None  Pertinent labs & imaging results that were available during my care of the patient were reviewed by me and considered in my medical decision making (see chart for details).   Patient's symptoms seem most consistent with acid reflux/indigestion. Her labs and examination are reassuring. Her symptoms improved with a GI cocktail. I do not see any clinical indication or laboratory indication for CT of abdomen this point time. Do not suspect intra-abdominal emergency condition. I discussed return precautions and follow-up plan with the patient.  Patient / Family / Caregiver informed of clinical course, medical decision-making process, and agree with plan.   I discussed return precautions, follow-up instructions, and discharged instructions with patient and/or  family.  ___________________________________________   FINAL CLINICAL IMPRESSION(S) / ED DIAGNOSES   Final diagnoses:  Epigastric pain    FOLLOW UP  Referred to: Primary care physician, one week.   Governor Rooks, MD 10/08/14 [phone]

## 2014-10-08 NOTE — Discharge Instructions (Signed)
No certain cause was found for your upper abdominal pain, however your exam and evaluation are reassuring. I suspect this is probably due to acid reflux/indigestion. Return to the emergency department for any new or worsening condition including fever, passing out, chest pain, abdominal pain, vomiting, black or bloody stools, or any other symptoms concerning to you.    Abdominal Pain Many things can cause belly (abdominal) pain. Most times, the belly pain is not dangerous. Many cases of belly pain can be watched and treated at home. HOME CARE   Do not take medicines that help you go poop (laxatives) unless told to by your doctor.  Only take medicine as told by your doctor.  Eat or drink as told by your doctor. Your doctor will tell you if you should be on a special diet. GET HELP IF:  You do not know what is causing your belly pain.  You have belly pain while you are sick to your stomach (nauseous) or have runny poop (diarrhea).  You have pain while you pee or poop.  Your belly pain wakes you up at night.  You have belly pain that gets worse or better when you eat.  You have belly pain that gets worse when you eat fatty foods.  You have a fever. GET HELP RIGHT AWAY IF:   The pain does not go away within 2 hours.  You keep throwing up (vomiting).  The pain changes and is only in the right or left part of the belly.  You have bloody or tarry looking poop. MAKE SURE YOU:   Understand these instructions.  Will watch your condition.  Will get help right away if you are not doing well or get worse. Document Released: 08/08/2007 Document Revised: 02/24/2013 Document Reviewed: 10/29/2012 [facility] Patient Information 2015 [place], [place]. This information is not intended to replace advice given to you by your health care provider. Make sure you discuss any questions you have with your health care provider.

## 2015-08-22 ENCOUNTER — Other Ambulatory Visit: Payer: Self-pay | Admitting: Nurse Practitioner

## 2015-08-22 DIAGNOSIS — Z1231 Encounter for screening mammogram for malignant neoplasm of breast: Secondary | ICD-10-CM

## 2015-10-03 ENCOUNTER — Other Ambulatory Visit: Payer: Self-pay | Admitting: Nurse Practitioner

## 2015-10-03 ENCOUNTER — Ambulatory Visit
Admission: RE | Admit: 2015-10-03 | Discharge: 2015-10-03 | Disposition: A | Discharge status: Home / Self Care | Payer: BC Managed Care – PPO | Source: Ambulatory Visit | Attending: Nurse Practitioner | Admitting: Nurse Practitioner

## 2015-10-03 DIAGNOSIS — Z1231 Encounter for screening mammogram for malignant neoplasm of breast: Secondary | ICD-10-CM

## 2015-10-17 ENCOUNTER — Other Ambulatory Visit: Payer: Self-pay | Admitting: Specialist

## 2015-10-17 DIAGNOSIS — M7541 Impingement syndrome of right shoulder: Secondary | ICD-10-CM

## 2015-10-26 ENCOUNTER — Ambulatory Visit: Payer: BC Managed Care – PPO

## 2015-10-26 ENCOUNTER — Ambulatory Visit
Admission: RE | Admit: 2015-10-26 | Discharge: 2015-10-26 | Disposition: A | Discharge status: Home / Self Care | Payer: BC Managed Care – PPO | Source: Ambulatory Visit | Attending: Specialist | Admitting: Specialist

## 2015-10-26 DIAGNOSIS — M12811 Other specific arthropathies, not elsewhere classified, right shoulder: Secondary | ICD-10-CM | POA: Insufficient documentation

## 2015-10-26 DIAGNOSIS — M75121 Complete rotator cuff tear or rupture of right shoulder, not specified as traumatic: Secondary | ICD-10-CM | POA: Diagnosis not present

## 2015-10-26 DIAGNOSIS — M7541 Impingement syndrome of right shoulder: Secondary | ICD-10-CM

## 2015-10-26 DIAGNOSIS — M629 Disorder of muscle, unspecified: Secondary | ICD-10-CM | POA: Diagnosis not present

## 2015-11-04 DIAGNOSIS — J40 Bronchitis, not specified as acute or chronic: Secondary | ICD-10-CM

## 2015-11-04 HISTORY — DX: Bronchitis, not specified as acute or chronic: J40

## 2015-12-29 ENCOUNTER — Encounter: Payer: Self-pay | Admitting: *Deleted

## 2015-12-29 ENCOUNTER — Other Ambulatory Visit: Payer: Self-pay | Admitting: Orthopedic Surgery

## 2015-12-30 ENCOUNTER — Ambulatory Visit: Payer: BC Managed Care – PPO | Admitting: Anesthesiology

## 2015-12-30 ENCOUNTER — Ambulatory Visit
Admission: RE | Admit: 2015-12-30 | Discharge: 2015-12-30 | Disposition: A | Discharge status: Home / Self Care | Payer: BC Managed Care – PPO | Source: Ambulatory Visit | Attending: Unknown Physician Specialty | Admitting: Unknown Physician Specialty

## 2015-12-30 ENCOUNTER — Encounter: Payer: Self-pay | Admitting: *Deleted

## 2015-12-30 ENCOUNTER — Ambulatory Visit: Admit: 2015-12-30 | Payer: BC Managed Care – PPO | Admitting: Unknown Physician Specialty

## 2015-12-30 ENCOUNTER — Encounter
Admission: RE | Disposition: A | Discharge status: Home / Self Care | Payer: Self-pay | Source: Ambulatory Visit | Attending: Unknown Physician Specialty

## 2015-12-30 DIAGNOSIS — K640 First degree hemorrhoids: Secondary | ICD-10-CM | POA: Insufficient documentation

## 2015-12-30 DIAGNOSIS — I509 Heart failure, unspecified: Secondary | ICD-10-CM | POA: Insufficient documentation

## 2015-12-30 DIAGNOSIS — E785 Hyperlipidemia, unspecified: Secondary | ICD-10-CM | POA: Diagnosis not present

## 2015-12-30 DIAGNOSIS — Z1211 Encounter for screening for malignant neoplasm of colon: Secondary | ICD-10-CM | POA: Diagnosis not present

## 2015-12-30 DIAGNOSIS — I110 Hypertensive heart disease with heart failure: Secondary | ICD-10-CM | POA: Insufficient documentation

## 2015-12-30 DIAGNOSIS — Z6832 Body mass index (BMI) 32.0-32.9, adult: Secondary | ICD-10-CM | POA: Insufficient documentation

## 2015-12-30 DIAGNOSIS — Q438 Other specified congenital malformations of intestine: Secondary | ICD-10-CM | POA: Insufficient documentation

## 2015-12-30 DIAGNOSIS — F419 Anxiety disorder, unspecified: Secondary | ICD-10-CM | POA: Diagnosis not present

## 2015-12-30 DIAGNOSIS — K219 Gastro-esophageal reflux disease without esophagitis: Secondary | ICD-10-CM | POA: Insufficient documentation

## 2015-12-30 HISTORY — DX: Localized adiposity: E65

## 2015-12-30 HISTORY — DX: Essential (primary) hypertension: I10

## 2015-12-30 HISTORY — DX: Sleep disorder, unspecified: G47.9

## 2015-12-30 HISTORY — DX: Gastro-esophageal reflux disease without esophagitis: K21.9

## 2015-12-30 HISTORY — PX: COLONOSCOPY WITH PROPOFOL: SHX5780

## 2015-12-30 HISTORY — DX: Anxiety disorder, unspecified: F41.9

## 2015-12-30 HISTORY — DX: Hyperlipidemia, unspecified: E78.5

## 2015-12-30 SURGERY — COLONOSCOPY WITH PROPOFOL
Anesthesia: General

## 2015-12-30 MED ORDER — SODIUM CHLORIDE 0.9 % IV SOLN
INTRAVENOUS | Status: DC
  Administered 2015-12-30: 1000 mL via INTRAVENOUS

## 2015-12-30 MED ORDER — LIDOCAINE 2% (20 MG/ML) 5 ML SYRINGE
INTRAMUSCULAR | Status: DC | PRN
  Administered 2015-12-30: 40 mg via INTRAVENOUS

## 2015-12-30 MED ORDER — PROPOFOL 10 MG/ML IV BOLUS
INTRAVENOUS | Status: DC | PRN
  Administered 2015-12-30: 100 mg via INTRAVENOUS

## 2015-12-30 MED ORDER — EPHEDRINE SULFATE 50 MG/ML IJ SOLN
INTRAMUSCULAR | Status: DC | PRN
  Administered 2015-12-30: 100 mg via INTRAVENOUS

## 2015-12-30 MED ORDER — MIDAZOLAM HCL 5 MG/5ML IJ SOLN
INTRAMUSCULAR | Status: DC | PRN
  Administered 2015-12-30: 1 mg via INTRAVENOUS

## 2015-12-30 MED ORDER — FENTANYL CITRATE (PF) 100 MCG/2ML IJ SOLN
INTRAMUSCULAR | Status: DC | PRN
  Administered 2015-12-30: 50 ug via INTRAVENOUS

## 2015-12-30 MED ORDER — SODIUM CHLORIDE 0.9 % IV SOLN: INTRAVENOUS | Status: DC

## 2015-12-30 MED ORDER — PROPOFOL 500 MG/50ML IV EMUL
INTRAVENOUS | Status: DC | PRN
  Administered 2015-12-30: 150 ug/kg/min via INTRAVENOUS

## 2015-12-30 NOTE — Transfer of Care (Signed)
Immediate Anesthesia Transfer of Care Note  Patient: Judy Burnett  Procedure(s) Performed: Procedure(s): COLONOSCOPY WITH PROPOFOL (N/A)  Patient Location: PACU and Endoscopy Unit  Anesthesia Type:General  Level of Consciousness: awake, oriented and patient cooperative  Airway & Oxygen Therapy: Patient Spontanous Breathing and Patient connected to nasal cannula oxygen  Post-op Assessment: Report given to RN and Post -op Vital signs reviewed and stable  Post vital signs: Reviewed and stable  Last Vitals:  Vitals:   12/30/15 1309  BP: 117/76  Pulse: 73  Resp: 20  Temp: 37.6 C    Last Pain:  Vitals:   12/30/15 1309  TempSrc: Tympanic         Complications: No apparent anesthesia complications

## 2015-12-30 NOTE — Anesthesia Preprocedure Evaluation (Signed)
Anesthesia Evaluation  Patient identified by MRN, date of birth, ID band Patient awake    Reviewed: Allergy & Precautions, NPO status , Patient's Chart, lab work & pertinent test results  History of Anesthesia Complications (+) history of anesthetic complications  Airway Mallampati: II       Dental  (+) Teeth Intact   Pulmonary neg pulmonary ROS,    breath sounds clear to auscultation       Cardiovascular Exercise Tolerance: Good hypertension, Pt. on medications  Rhythm:Regular     Neuro/Psych negative neurological ROS     GI/Hepatic Neg liver ROS, GERD  ,  Endo/Other  negative endocrine ROSMorbid obesity  Renal/GU negative Renal ROS     Musculoskeletal negative musculoskeletal ROS (+)   Abdominal (+) + obese,   Peds  Hematology negative hematology ROS (+)   Anesthesia Other Findings   Reproductive/Obstetrics                             Anesthesia Physical Anesthesia Plan  ASA: II  Anesthesia Plan: General   Post-op Pain Management:    Induction: Intravenous  Airway Management Planned: Natural Airway and Nasal Cannula  Additional Equipment:   Intra-op Plan:   Post-operative Plan:   Informed Consent: I have reviewed the patients History and Physical, chart, labs and discussed the procedure including the risks, benefits and alternatives for the proposed anesthesia with the patient or authorized representative who has indicated his/her understanding and acceptance.     Plan Discussed with: CRNA  Anesthesia Plan Comments:         Anesthesia Quick Evaluation

## 2015-12-30 NOTE — Op Note (Signed)
[facility] Gastroenterology Patient Name: Judy LeitzLeslie Burnett Procedure Date: 12/30/2015 1:56 PM MRN: [id number] Account #: [id number] Date of Birth: October 03, 1965 Admit Type: Outpatient Age: [age] [facility] Gender: Female Note Status: Finalized Procedure:            Colonoscopy Indications:          Screening for colorectal malignant neoplasm Providers:            Scot Junobert T. Syair Fricker, MD Referring MD:         Erasmo DownerLindsey F. Strader (Referring MD) Medicines:            Propofol per Anesthesia Complications:        No immediate complications. Procedure:            Pre-Anesthesia Assessment:                       - After reviewing the risks and benefits, the patient                        was deemed in satisfactory condition to undergo the                        procedure.                       After obtaining informed consent, the colonoscope was                        passed under direct vision. Throughout the procedure,                        the patient's blood pressure, pulse, and oxygen                        saturations were monitored continuously. The                        Colonoscope was introduced through the anus and                        advanced to the the cecum, identified by appendiceal                        orifice and ileocecal valve. The colonoscopy was                        unusually difficult due to restricted mobility of the                        colon and a tortuous colon. Successful completion of                        the procedure was aided by applying abdominal pressure.                        The patient tolerated the procedure well. The quality                        of the bowel preparation was excellent. Findings:      Internal hemorrhoids were found during endoscopy. The hemorrhoids were  small and Grade I (internal hemorrhoids that do not prolapse).      The exam was otherwise without abnormality. Impression:           -  Internal hemorrhoids.                       - The examination was otherwise normal.                       - No specimens collected. Recommendation:       - Repeat colonoscopy in 10 years for screening purposes. Scot Jun, MD 12/30/2015 2:30:48 PM This report has been signed electronically. Number of Addenda: 0 Note Initiated On: 12/30/2015 1:56 PM Scope Withdrawal Time: 0 hours 11 minutes 7 seconds  Total Procedure Duration: 0 hours 25 minutes 56 seconds       [facility]

## 2015-12-30 NOTE — H&P (Signed)
Primary Care Physician:  Erasmo DownerStrader, Lindsey F, NP Primary Gastroenterologist:  Dr. Mechele CollinElliott  Pre-Procedure History & Physical: HPI:  Judy Burnett is a 50 y.o. female is here for an colonoscopy.   Past Medical History:  Diagnosis Date  . Anxiety   . Central obesity   . CHF (congestive heart failure) (HCC)   . GERD (gastroesophageal reflux disease)   . Hyperlipidemia   . Hypertension   . Sleep disorder     Past Surgical History:  Procedure Laterality Date  . ABDOMINAL HYSTERECTOMY    . APPENDECTOMY    . BREAST BIOPSY Left 01/2008   neg  . DERMOID CYST REMOVAL  3/209    Prior to Admission medications   Medication Sig Start Date End Date Taking? Authorizing Provider  albuterol (PROVENTIL HFA;VENTOLIN HFA) 108 (90 Base) MCG/ACT inhaler Inhale into the lungs every 6 (six) hours as needed for wheezing or shortness of breath.   Yes Historical Provider, MD  ALPRAZolam Prudy Feeler(XANAX) 0.25 MG tablet Take 0.25 mg by mouth at bedtime as needed for anxiety.   Yes Historical Provider, MD  chlorpheniramine-HYDROcodone (TUSSIONEX PENNKINETIC ER) 10-8 MG/5ML SUER Take 5 mLs by mouth every 12 (twelve) hours as needed for cough.   Yes Historical Provider, MD  citalopram (CELEXA) 40 MG tablet Take 40 mg by mouth at bedtime.   Yes Historical Provider, MD  hydrochlorothiazide (HYDRODIURIL) 25 MG tablet Take 25 mg by mouth at bedtime.   Yes Historical Provider, MD  levothyroxine (SYNTHROID, LEVOTHROID) 50 MCG tablet Take 50 mcg by mouth daily before breakfast.   Yes Historical Provider, MD  Multiple Vitamins-Minerals (MULTIVITAMIN PO) Take 1 tablet by mouth at bedtime.   Yes Historical Provider, MD  omeprazole (PRILOSEC) 10 MG capsule Take 10 mg by mouth daily.   Yes Historical Provider, MD  rosuvastatin (CRESTOR) 20 MG tablet Take 20 mg by mouth at bedtime.   Yes Historical Provider, MD  triamcinolone cream (KENALOG) 0.5 % Apply 1 application topically 3 (three) times daily.   Yes Historical Provider, MD     Allergies as of 12/29/2015  . (No Known Allergies)    Family History  Problem Relation Age of Onset  . Breast cancer Neg Hx     Social History   Social History  . Marital status: Married    Spouse name: N/A  . Number of children: N/A  . Years of education: N/A   Occupational History  . Not on file.   Social History Main Topics  . Smoking status: Never Smoker  . Smokeless tobacco: Never Used  . Alcohol use No  . Drug use: No  . Sexual activity: Not on file   Other Topics Concern  . Not on file   Social History Narrative  . No narrative on file    Review of Systems: See HPI, otherwise negative ROS  Physical Exam: BP 117/76   Pulse 73   Temp 99.6 F (37.6 C) (Tympanic)   Resp 20   Ht 5\' 4"  (1.626 m)   Wt 86.2 kg (190 lb)   SpO2 100%   BMI 32.61 kg/m  General:   Alert,  pleasant and cooperative in NAD Head:  Normocephalic and atraumatic. Neck:  Supple; no masses or thyromegaly. Lungs:  Clear throughout to auscultation.    Heart:  Regular rate and rhythm. Abdomen:  Soft, nontender and nondistended. Normal bowel sounds, without guarding, and without rebound.   Neurologic:  Alert and  oriented x4;  grossly normal neurologically.  Impression/Plan:  Judy Burnett is here for an colonoscopy to be performed for screening colonoscopy  Risks, benefits, limitations, and alternatives regarding  colonoscopy have been reviewed with the patient.  Questions have been answered.  All parties agreeable.   Lynnae Prude, MD  12/30/2015, 1:50 PM

## 2016-01-02 ENCOUNTER — Encounter: Payer: Self-pay | Admitting: Unknown Physician Specialty

## 2016-01-04 NOTE — Anesthesia Postprocedure Evaluation (Signed)
Anesthesia Post Note  Patient: Judy Burnett  Procedure(s) Performed: Procedure(s) (LRB): COLONOSCOPY WITH PROPOFOL (N/A)  Patient location during evaluation: PACU Anesthesia Type: General Level of consciousness: awake Pain management: pain level controlled Vital Signs Assessment: post-procedure vital signs reviewed and stable Respiratory status: spontaneous breathing Cardiovascular status: stable Anesthetic complications: no    Last Vitals:  Vitals:   12/30/15 1456 12/30/15 1506  BP: (!) 141/80 129/72  Pulse: (!) 55 (!) 52  Resp: 17 15  Temp:      Last Pain:  Vitals:   12/30/15 1436  TempSrc: Tympanic                 VAN STAVEREN,Kellan Raffield

## 2016-01-05 ENCOUNTER — Other Ambulatory Visit (HOSPITAL_COMMUNITY): Payer: Self-pay | Admitting: Pharmacy Technician

## 2016-01-10 ENCOUNTER — Ambulatory Visit
Admission: RE | Admit: 2016-01-10 | Discharge: 2016-01-10 | Disposition: A | Discharge status: Home / Self Care | Payer: BC Managed Care – PPO | Source: Ambulatory Visit | Attending: Orthopedic Surgery | Admitting: Orthopedic Surgery

## 2016-01-10 ENCOUNTER — Other Ambulatory Visit: Payer: BC Managed Care – PPO

## 2016-01-10 DIAGNOSIS — Z01810 Encounter for preprocedural cardiovascular examination: Secondary | ICD-10-CM | POA: Insufficient documentation

## 2016-01-10 DIAGNOSIS — Z01812 Encounter for preprocedural laboratory examination: Secondary | ICD-10-CM | POA: Insufficient documentation

## 2016-01-10 DIAGNOSIS — I10 Essential (primary) hypertension: Secondary | ICD-10-CM | POA: Diagnosis not present

## 2016-01-10 HISTORY — DX: Hypothyroidism, unspecified: E03.9

## 2016-01-10 HISTORY — DX: Bronchitis, not specified as acute or chronic: J40

## 2016-01-10 LAB — BASIC METABOLIC PANEL
Anion gap: 6 (ref 5–15)
BUN: 13 mg/dL (ref 6–20)
CHLORIDE: 106 mmol/L (ref 101–111)
CO2: 25 mmol/L (ref 22–32)
Calcium: 9.5 mg/dL (ref 8.9–10.3)
Creatinine, Ser: 0.62 mg/dL (ref 0.44–1.00)
GFR calc Af Amer: >60 mL/min (ref >60)
GFR calc non Af Amer: >60 mL/min (ref >60)
GLUCOSE: 113 mg/dL — AB (ref 65–99)
POTASSIUM: 3.7 mmol/L (ref 3.5–5.1)
Sodium: 137 mmol/L (ref 135–145)

## 2016-01-10 LAB — PROTIME-INR
INR: 0.97
Prothrombin Time: 12.9 seconds (ref 11.4–15.2)

## 2016-01-10 LAB — CBC WITH DIFFERENTIAL/PLATELET
Basophils Absolute: 0.1 10*3/uL (ref 0–0.1)
Basophils Relative: 1 %
EOS PCT: 3 %
Eosinophils Absolute: 0.3 10*3/uL (ref 0–0.7)
HEMATOCRIT: 38.3 % (ref 35.0–47.0)
Hemoglobin: 13 g/dL (ref 12.0–16.0)
LYMPHS ABS: 3.4 10*3/uL (ref 1.0–3.6)
LYMPHS PCT: 32 %
MCH: 28.6 pg (ref 26.0–34.0)
MCHC: 34 g/dL (ref 32.0–36.0)
MCV: 84.1 fL (ref 80.0–100.0)
MONO ABS: 0.6 10*3/uL (ref 0.2–0.9)
Monocytes Relative: 6 %
NEUTROS ABS: 6.2 10*3/uL (ref 1.4–6.5)
Neutrophils Relative %: 58 %
PLATELETS: 256 10*3/uL (ref 150–440)
RBC: 4.55 MIL/uL (ref 3.80–5.20)
RDW: 13.2 % (ref 11.5–14.5)
WBC: 10.7 10*3/uL (ref 3.6–11.0)

## 2016-01-10 LAB — APTT: aPTT: 28 seconds (ref 24–36)

## 2016-01-10 NOTE — Patient Instructions (Signed)
  Your procedure is scheduled ZO:XWRUEAVWon:Thursday Nov. 16, 2017. Report to Same Day Surgery. To find out your arrival time please call [phone] between 1PM - 3PM on Wednesday Nov. 15, 2017.  Remember: Instructions that are not followed completely may result in serious medical risk, up to and including death, or upon the discretion of your surgeon and anesthesiologist your surgery may need to be rescheduled.    _x___ 1. Do not eat food or drink liquids after midnight. No gum chewing or hard candies.     ____ 2. No Alcohol for 24 hours before or after surgery.   ____ 3. Bring all medications with you on the day of surgery if instructed.    __x__ 4. Notify your doctor if there is any change in your medical condition     (cold, fever, infections).    _____ 5. No smoking 24 hours prior to surgery.     Do not wear jewelry, make-up, hairpins, clips or nail polish.  Do not wear lotions, powders, or perfumes.   Do not shave 48 hours prior to surgery. Men may shave face and neck.  Do not bring valuables to the hospital.    [facility] is not responsible for any belongings or valuables.               Contacts, dentures or bridgework may not be worn into surgery.  Leave your suitcase in the car. After surgery it may be brought to your room.  For patients admitted to the hospital, discharge time is determined by your treatment team.   Patients discharged the day of surgery will not be allowed to drive home.    Please read over the following fact sheets that you were given:   [facility] Preparing for [facility]  _x___ Take these medicines the morning of surgery with A SIP OF WATER:    1. levothyroxine (SYNTHROID, LEVOTHROID)   2. omeprazole (PRILOSEC)    ____ Fleet Enema (as directed)   _x___ Use CHG Soap as directed on instruction sheet  ____ Use inhalers on the day of surgery and bring to hospital day of surgery  ____ Stop metformin 2 days prior to surgery    ____ Take 1/2 of usual  insulin dose the night before surgery and none on the morning of surgery.   ____ Stop Coumadin/Plavix/aspirin on does not apply.  _x___ Stop Anti-inflammatories such as Advil, Aleve, Ibuprofen, Motrin, Naproxen,aspirin-acetaminophen-caffeine (EXCEDRIN MIGRAINE)   Naprosyn,  Goodies powders or aspirin products. OK to take Tylenol.   ____ Stop supplements until after surgery.    ____ Bring C-Pap to the hospital.

## 2016-01-10 NOTE — Pre-Procedure Instructions (Addendum)
Pt had a dry cough through out  PAT interview, reports being diagnosed with bronchitis in Sept. 2017.  Has been seen by 3 doctors for this bronchitis, [facility]  Pt reports she is feeling better, no longer using inhaler. Will finish her antibiotic, doxycycline this Sunday 01/15/16, DOS is 01/19/16.    Spoke with Dr. Pernell DupreAdams regarding above,  Asked if any further testing or clearance is needed.  Dr. Pernell DupreAdams says no addition testing at this time, pt will be evaluated the am of surgery and a decision to proceed will be done at that time.

## 2016-01-18 MED ORDER — CEFAZOLIN SODIUM-DEXTROSE 2-4 GM/100ML-% IV SOLN
2.0000 g | INTRAVENOUS | Status: AC
  Administered 2016-01-19: 2 g via INTRAVENOUS

## 2016-01-19 ENCOUNTER — Encounter: Payer: Self-pay | Admitting: Emergency Medicine

## 2016-01-19 ENCOUNTER — Emergency Department: Payer: BC Managed Care – PPO

## 2016-01-19 ENCOUNTER — Observation Stay: Payer: BC Managed Care – PPO

## 2016-01-19 ENCOUNTER — Ambulatory Visit
Admission: RE | Admit: 2016-01-19 | Discharge: 2016-01-19 | Disposition: A | Discharge status: Home / Self Care | Payer: BC Managed Care – PPO | Source: Ambulatory Visit | Attending: Orthopedic Surgery | Admitting: Orthopedic Surgery

## 2016-01-19 ENCOUNTER — Encounter
Admission: RE | Disposition: A | Discharge status: Home / Self Care | Payer: Self-pay | Source: Ambulatory Visit | Attending: Orthopedic Surgery

## 2016-01-19 ENCOUNTER — Ambulatory Visit: Payer: BC Managed Care – PPO | Admitting: Certified Registered Nurse Anesthetist

## 2016-01-19 ENCOUNTER — Observation Stay
Admission: EM | Admit: 2016-01-19 | Discharge: 2016-01-21 | Disposition: A | Discharge status: Home / Self Care | Payer: BC Managed Care – PPO | Attending: Orthopedic Surgery | Admitting: Orthopedic Surgery

## 2016-01-19 DIAGNOSIS — M19011 Primary osteoarthritis, right shoulder: Secondary | ICD-10-CM | POA: Insufficient documentation

## 2016-01-19 DIAGNOSIS — M75121 Complete rotator cuff tear or rupture of right shoulder, not specified as traumatic: Secondary | ICD-10-CM | POA: Diagnosis not present

## 2016-01-19 DIAGNOSIS — F419 Anxiety disorder, unspecified: Secondary | ICD-10-CM | POA: Diagnosis not present

## 2016-01-19 DIAGNOSIS — E785 Hyperlipidemia, unspecified: Secondary | ICD-10-CM | POA: Insufficient documentation

## 2016-01-19 DIAGNOSIS — E039 Hypothyroidism, unspecified: Secondary | ICD-10-CM | POA: Insufficient documentation

## 2016-01-19 DIAGNOSIS — G479 Sleep disorder, unspecified: Secondary | ICD-10-CM | POA: Diagnosis not present

## 2016-01-19 DIAGNOSIS — Z90710 Acquired absence of both cervix and uterus: Secondary | ICD-10-CM | POA: Insufficient documentation

## 2016-01-19 DIAGNOSIS — K219 Gastro-esophageal reflux disease without esophagitis: Secondary | ICD-10-CM | POA: Diagnosis not present

## 2016-01-19 DIAGNOSIS — M6281 Muscle weakness (generalized): Secondary | ICD-10-CM

## 2016-01-19 DIAGNOSIS — R0602 Shortness of breath: Secondary | ICD-10-CM | POA: Diagnosis present

## 2016-01-19 DIAGNOSIS — I110 Hypertensive heart disease with heart failure: Secondary | ICD-10-CM | POA: Diagnosis not present

## 2016-01-19 DIAGNOSIS — I509 Heart failure, unspecified: Secondary | ICD-10-CM | POA: Insufficient documentation

## 2016-01-19 HISTORY — PX: SHOULDER ARTHROSCOPY WITH OPEN ROTATOR CUFF REPAIR: SHX6092

## 2016-01-19 LAB — CBC WITH DIFFERENTIAL/PLATELET
BASOS PCT: 1 %
Basophils Absolute: 0.1 10*3/uL (ref 0–0.1)
EOS ABS: 0 10*3/uL (ref 0–0.7)
Eosinophils Relative: 0 %
HCT: 38.4 % (ref 35.0–47.0)
Hemoglobin: 12.8 g/dL (ref 12.0–16.0)
LYMPHS ABS: 1.7 10*3/uL (ref 1.0–3.6)
Lymphocytes Relative: 10 %
MCH: 28.2 pg (ref 26.0–34.0)
MCHC: 33.4 g/dL (ref 32.0–36.0)
MCV: 84.4 fL (ref 80.0–100.0)
MONO ABS: 0.6 10*3/uL (ref 0.2–0.9)
MONOS PCT: 3 %
Neutro Abs: 15.2 10*3/uL — ABNORMAL HIGH (ref 1.4–6.5)
Neutrophils Relative %: 86 %
Platelets: 272 10*3/uL (ref 150–440)
RBC: 4.55 MIL/uL (ref 3.80–5.20)
RDW: 13.4 % (ref 11.5–14.5)
WBC: 17.6 10*3/uL — ABNORMAL HIGH (ref 3.6–11.0)

## 2016-01-19 LAB — BASIC METABOLIC PANEL
Anion gap: 9 (ref 5–15)
BUN: 7 mg/dL (ref 6–20)
CALCIUM: 10.2 mg/dL (ref 8.9–10.3)
CO2: 27 mmol/L (ref 22–32)
CREATININE: 0.65 mg/dL (ref 0.44–1.00)
Chloride: 102 mmol/L (ref 101–111)
GFR calc Af Amer: >60 mL/min (ref >60)
GFR calc non Af Amer: >60 mL/min (ref >60)
GLUCOSE: 142 mg/dL — AB (ref 65–99)
Potassium: 3.4 mmol/L — ABNORMAL LOW (ref 3.5–5.1)
Sodium: 138 mmol/L (ref 135–145)

## 2016-01-19 LAB — URINALYSIS COMPLETE WITH MICROSCOPIC (ARMC ONLY)
BILIRUBIN URINE: NEGATIVE
Bacteria, UA: NONE SEEN
Glucose, UA: NEGATIVE mg/dL
HGB URINE DIPSTICK: NEGATIVE
KETONES UR: NEGATIVE mg/dL
LEUKOCYTES UA: NEGATIVE
NITRITE: NEGATIVE
PH: 6 (ref 5.0–8.0)
Protein, ur: NEGATIVE mg/dL
RBC / HPF: NONE SEEN RBC/hpf (ref 0–5)
SPECIFIC GRAVITY, URINE: 1.009 (ref 1.005–1.030)

## 2016-01-19 SURGERY — ARTHROSCOPY, SHOULDER WITH REPAIR, ROTATOR CUFF, OPEN
Anesthesia: Choice | Site: Shoulder | Laterality: Right | Wound class: Clean

## 2016-01-19 MED ORDER — LORAZEPAM 2 MG/ML IJ SOLN
1.0000 mg | Freq: Once | INTRAMUSCULAR | Status: AC
  Administered 2016-01-19: 1 mg via INTRAVENOUS
  Filled 2016-01-19: qty 1

## 2016-01-19 MED ORDER — GLYCOPYRROLATE 0.2 MG/ML IJ SOLN
INTRAMUSCULAR | Status: DC | PRN
  Administered 2016-01-19: 0.2 mg via INTRAVENOUS

## 2016-01-19 MED ORDER — ONDANSETRON HCL 4 MG/2ML IJ SOLN
INTRAMUSCULAR | Status: DC | PRN
  Administered 2016-01-19: 4 mg via INTRAVENOUS

## 2016-01-19 MED ORDER — ALBUTEROL SULFATE (2.5 MG/3ML) 0.083% IN NEBU
INHALATION_SOLUTION | RESPIRATORY_TRACT | Status: AC
  Administered 2016-01-19: 2.5 mg via RESPIRATORY_TRACT
  Filled 2016-01-19: qty 3

## 2016-01-19 MED ORDER — CHLORHEXIDINE GLUCONATE CLOTH 2 % EX PADS: 6.0000 | MEDICATED_PAD | Freq: Once | CUTANEOUS | Status: DC

## 2016-01-19 MED ORDER — LIDOCAINE HCL (PF) 1 % IJ SOLN
INTRAMUSCULAR | Status: AC
  Filled 2016-01-19: qty 5

## 2016-01-19 MED ORDER — ACETAMINOPHEN 10 MG/ML IV SOLN
INTRAVENOUS | Status: DC | PRN
  Administered 2016-01-19: 1000 mg via INTRAVENOUS

## 2016-01-19 MED ORDER — MIDAZOLAM HCL 2 MG/2ML IJ SOLN
1.0000 mg | Freq: Once | INTRAMUSCULAR | Status: AC
  Administered 2016-01-19: 2 mg via INTRAVENOUS

## 2016-01-19 MED ORDER — ROCURONIUM BROMIDE 100 MG/10ML IV SOLN
INTRAVENOUS | Status: DC | PRN
  Administered 2016-01-19 (×3): 10 mg via INTRAVENOUS
  Administered 2016-01-19 (×2): 20 mg via INTRAVENOUS
  Administered 2016-01-19: 10 mg via INTRAVENOUS

## 2016-01-19 MED ORDER — LACTATED RINGERS IV SOLN
INTRAVENOUS | Status: DC
  Administered 2016-01-19: 07:00:00 via INTRAVENOUS

## 2016-01-19 MED ORDER — ONDANSETRON HCL 4 MG PO TABS: 4.0000 mg | ORAL_TABLET | Freq: Three times a day (TID) | ORAL | 0 refills | Status: DC | PRN

## 2016-01-19 MED ORDER — ONDANSETRON HCL 4 MG/2ML IJ SOLN
4.0000 mg | Freq: Four times a day (QID) | INTRAMUSCULAR | Status: DC
  Administered 2016-01-20 – 2016-01-21 (×3): 4 mg via INTRAVENOUS
  Filled 2016-01-19 (×4): qty 2

## 2016-01-19 MED ORDER — EPINEPHRINE PF 1 MG/ML IJ SOLN
INTRAMUSCULAR | Status: DC | PRN
  Administered 2016-01-19: 9 mL

## 2016-01-19 MED ORDER — MAGNESIUM HYDROXIDE 400 MG/5ML PO SUSP: 30.0000 mL | Freq: Every day | ORAL | Status: DC | PRN

## 2016-01-19 MED ORDER — SODIUM CHLORIDE 0.9 % IV SOLN
INTRAVENOUS | Status: DC | PRN
  Administered 2016-01-19: 25 ug/min via INTRAVENOUS

## 2016-01-19 MED ORDER — SODIUM CHLORIDE 0.9 % IV SOLN
INTRAVENOUS | Status: DC
  Administered 2016-01-19 – 2016-01-21 (×4): via INTRAVENOUS

## 2016-01-19 MED ORDER — SUCCINYLCHOLINE CHLORIDE 20 MG/ML IJ SOLN
INTRAMUSCULAR | Status: DC | PRN
  Administered 2016-01-19: 120 mg via INTRAVENOUS

## 2016-01-19 MED ORDER — FENTANYL CITRATE (PF) 100 MCG/2ML IJ SOLN
INTRAMUSCULAR | Status: AC
  Filled 2016-01-19: qty 2

## 2016-01-19 MED ORDER — EPINEPHRINE 30 MG/30ML IJ SOLN
INTRAMUSCULAR | Status: AC
  Filled 2016-01-19: qty 1

## 2016-01-19 MED ORDER — FAMOTIDINE 20 MG PO TABS: 20.0000 mg | ORAL_TABLET | Freq: Once | ORAL | Status: DC

## 2016-01-19 MED ORDER — NEOMYCIN-POLYMYXIN B GU 40-200000 IR SOLN
Status: DC | PRN
  Administered 2016-01-19: 2 mL

## 2016-01-19 MED ORDER — SUGAMMADEX SODIUM 200 MG/2ML IV SOLN
INTRAVENOUS | Status: DC | PRN
  Administered 2016-01-19: 174.2 mg via INTRAVENOUS

## 2016-01-19 MED ORDER — ASPIRIN-ACETAMINOPHEN-CAFFEINE 250-250-65 MG PO TABS: 1.0000 | ORAL_TABLET | Freq: Four times a day (QID) | ORAL | Status: DC | PRN

## 2016-01-19 MED ORDER — IOPAMIDOL (ISOVUE-370) INJECTION 76%
75.0000 mL | Freq: Once | INTRAVENOUS | Status: AC | PRN
  Administered 2016-01-19: 75 mL via INTRAVENOUS

## 2016-01-19 MED ORDER — PHENYLEPHRINE HCL 10 MG/ML IJ SOLN
INTRAMUSCULAR | Status: DC | PRN
  Administered 2016-01-19: 50 ug via INTRAVENOUS
  Administered 2016-01-19: 100 ug via INTRAVENOUS

## 2016-01-19 MED ORDER — MIDAZOLAM HCL 5 MG/ML IJ SOLN: 2.0000 mg | Freq: Once | INTRAMUSCULAR | Status: DC

## 2016-01-19 MED ORDER — MORPHINE SULFATE (PF) 4 MG/ML IV SOLN
2.0000 mg | INTRAVENOUS | Status: DC | PRN
  Administered 2016-01-20 (×4): 2 mg via INTRAVENOUS
  Filled 2016-01-19 (×5): qty 1

## 2016-01-19 MED ORDER — PROPOFOL 10 MG/ML IV BOLUS
INTRAVENOUS | Status: DC | PRN
  Administered 2016-01-19: 180 mg via INTRAVENOUS

## 2016-01-19 MED ORDER — BUPIVACAINE HCL (PF) 0.25 % IJ SOLN
INTRAMUSCULAR | Status: DC | PRN
  Administered 2016-01-19: 30 mL

## 2016-01-19 MED ORDER — MAGNESIUM CITRATE PO SOLN
1.0000 | Freq: Once | ORAL | Status: DC | PRN
  Filled 2016-01-19: qty 296

## 2016-01-19 MED ORDER — ALBUTEROL SULFATE (2.5 MG/3ML) 0.083% IN NEBU
2.5000 mg | INHALATION_SOLUTION | RESPIRATORY_TRACT | Status: DC | PRN
  Administered 2016-01-19: 2.5 mg via RESPIRATORY_TRACT

## 2016-01-19 MED ORDER — OXYCODONE HCL 5 MG PO TABS: 5.0000 mg | ORAL_TABLET | ORAL | 0 refills | Status: DC | PRN

## 2016-01-19 MED ORDER — OXYCODONE HCL 5 MG PO TABS
5.0000 mg | ORAL_TABLET | ORAL | Status: DC | PRN
  Administered 2016-01-19: 5 mg via ORAL
  Administered 2016-01-20 (×2): 10 mg via ORAL
  Administered 2016-01-20: 5 mg via ORAL
  Filled 2016-01-19: qty 2
  Filled 2016-01-19: qty 1
  Filled 2016-01-19 (×2): qty 2
  Filled 2016-01-19: qty 1

## 2016-01-19 MED ORDER — NEOMYCIN-POLYMYXIN B GU 40-200000 IR SOLN
Status: AC
  Filled 2016-01-19: qty 2

## 2016-01-19 MED ORDER — FAMOTIDINE 20 MG PO TABS
ORAL_TABLET | ORAL | Status: AC
  Filled 2016-01-19: qty 1

## 2016-01-19 MED ORDER — FENTANYL CITRATE (PF) 100 MCG/2ML IJ SOLN: 25.0000 ug | INTRAMUSCULAR | Status: DC | PRN

## 2016-01-19 MED ORDER — DEXAMETHASONE SODIUM PHOSPHATE 10 MG/ML IJ SOLN
INTRAMUSCULAR | Status: DC | PRN
  Administered 2016-01-19: 4 mg via INTRAVENOUS

## 2016-01-19 MED ORDER — ONDANSETRON HCL 4 MG/2ML IJ SOLN
4.0000 mg | Freq: Once | INTRAMUSCULAR | Status: AC | PRN
  Administered 2016-01-19: 4 mg via INTRAVENOUS

## 2016-01-19 MED ORDER — LIDOCAINE HCL (PF) 1 % IJ SOLN
INTRAMUSCULAR | Status: AC
  Filled 2016-01-19: qty 30

## 2016-01-19 MED ORDER — MIDAZOLAM HCL 5 MG/5ML IJ SOLN
INTRAMUSCULAR | Status: AC
  Administered 2016-01-19: 2 mg via INTRAVENOUS
  Filled 2016-01-19: qty 5

## 2016-01-19 MED ORDER — LIDOCAINE HCL (PF) 1 % IJ SOLN
INTRAMUSCULAR | Status: DC | PRN
  Administered 2016-01-19: 18 mL

## 2016-01-19 MED ORDER — FENTANYL CITRATE (PF) 100 MCG/2ML IJ SOLN
INTRAMUSCULAR | Status: DC | PRN
  Administered 2016-01-19 (×2): 50 ug via INTRAVENOUS

## 2016-01-19 MED ORDER — DOCUSATE SODIUM 100 MG PO CAPS
100.0000 mg | ORAL_CAPSULE | Freq: Two times a day (BID) | ORAL | Status: DC
  Administered 2016-01-19 – 2016-01-21 (×4): 100 mg via ORAL
  Filled 2016-01-19 (×4): qty 1

## 2016-01-19 MED ORDER — ACETAMINOPHEN 10 MG/ML IV SOLN
INTRAVENOUS | Status: AC
Start: 2016-01-19 — End: 2016-01-19
  Filled 2016-01-19: qty 100

## 2016-01-19 MED ORDER — LIDOCAINE HCL (CARDIAC) 20 MG/ML IV SOLN
INTRAVENOUS | Status: DC | PRN
  Administered 2016-01-19: 100 mg via INTRAVENOUS

## 2016-01-19 MED ORDER — BISACODYL 5 MG PO TBEC
5.0000 mg | DELAYED_RELEASE_TABLET | Freq: Every day | ORAL | Status: DC | PRN
Start: 2016-01-19 — End: 2016-01-21

## 2016-01-19 MED ORDER — ROPIVACAINE HCL 5 MG/ML IJ SOLN
INTRAMUSCULAR | Status: AC
  Filled 2016-01-19: qty 40

## 2016-01-19 MED ORDER — BUPIVACAINE HCL (PF) 0.25 % IJ SOLN
INTRAMUSCULAR | Status: AC
  Filled 2016-01-19: qty 30

## 2016-01-19 MED ORDER — IPRATROPIUM-ALBUTEROL 0.5-2.5 (3) MG/3ML IN SOLN
3.0000 mL | Freq: Once | RESPIRATORY_TRACT | Status: AC
  Administered 2016-01-19: 3 mL via RESPIRATORY_TRACT
  Filled 2016-01-19: qty 3

## 2016-01-19 SURGICAL SUPPLY — 70 items
ADAPTER IRRIG TUBE 2 SPIKE SOL (ADAPTER) ×4 IMPLANT
ANCHOR ALL-SUT Q-FIX 2.8 (Anchor) ×2 IMPLANT
BUR RADIUS 4.0X18.5 (BURR) ×2 IMPLANT
BUR RADIUS 5.5 (BURR) ×2 IMPLANT
CANISTER SUCT LVC 12 LTR MEDI- (MISCELLANEOUS) ×2 IMPLANT
CANNULA 5.75X7 CRYSTAL CLEAR (CANNULA) ×4 IMPLANT
CANNULA PARTIAL THREAD 2X7 (CANNULA) ×2 IMPLANT
CANNULA TWIST IN 8.25X9CM (CANNULA) ×4 IMPLANT
CONNECTOR PERFECT PASSER (CONNECTOR) ×2 IMPLANT
COOLER POLAR GLACIER W/PUMP (MISCELLANEOUS) ×2 IMPLANT
CRADLE LAMINECT ARM (MISCELLANEOUS) ×2 IMPLANT
DEVICE SUCT BLK HOLE OR FLOOR (MISCELLANEOUS) ×4 IMPLANT
DRAPE IMP U-DRAPE 54X76 (DRAPES) ×4 IMPLANT
DRAPE INCISE IOBAN 66X45 STRL (DRAPES) ×2 IMPLANT
DRAPE SHEET LG 3/4 BI-LAMINATE (DRAPES) ×2 IMPLANT
DRAPE U-SHAPE 47X51 STRL (DRAPES) ×2 IMPLANT
DURAPREP 26ML APPLICATOR (WOUND CARE) ×6 IMPLANT
ELECT REM PT RETURN 9FT ADLT (ELECTROSURGICAL) ×2
ELECTRODE REM PT RTRN 9FT ADLT (ELECTROSURGICAL) ×1 IMPLANT
GAUZE PETRO XEROFOAM 1X8 (MISCELLANEOUS) ×2 IMPLANT
GAUZE SPONGE 4X4 12PLY STRL (GAUZE/BANDAGES/DRESSINGS) ×4 IMPLANT
GLOVE BIOGEL PI IND STRL 9 (GLOVE) ×2 IMPLANT
GLOVE BIOGEL PI INDICATOR 9 (GLOVE) ×2
GLOVE SURG 9.0 ORTHO LTXF (GLOVE) ×4 IMPLANT
GOWN STRL REUS TWL 2XL XL LVL4 (GOWN DISPOSABLE) ×2 IMPLANT
GOWN STRL REUS W/ TWL LRG LVL3 (GOWN DISPOSABLE) ×1 IMPLANT
GOWN STRL REUS W/ TWL LRG LVL4 (GOWN DISPOSABLE) ×1 IMPLANT
GOWN STRL REUS W/TWL LRG LVL3 (GOWN DISPOSABLE) ×1
GOWN STRL REUS W/TWL LRG LVL4 (GOWN DISPOSABLE) ×1
IV LACTATED RINGER IRRG 3000ML (IV SOLUTION) ×9
IV LR IRRIG 3000ML ARTHROMATIC (IV SOLUTION) ×9 IMPLANT
KIT RM TURNOVER STRD PROC AR (KITS) ×2 IMPLANT
KIT STABILIZATION SHOULDER (MISCELLANEOUS) ×2 IMPLANT
KIT SUTURE 2.8 Q-FIX DISP (MISCELLANEOUS) ×2 IMPLANT
KIT SUTURETAK 3.0 INSERT PERC (KITS) IMPLANT
MANIFOLD NEPTUNE II (INSTRUMENTS) ×2 IMPLANT
MASK FACE SPIDER DISP (MASK) ×2 IMPLANT
MAT BLUE FLOOR 46X72 FLO (MISCELLANEOUS) ×4 IMPLANT
NDL SAFETY 18GX1.5 (NEEDLE) ×2 IMPLANT
NDL SAFETY 22GX1.5 (NEEDLE) ×2 IMPLANT
NS IRRIG 500ML POUR BTL (IV SOLUTION) ×2 IMPLANT
PACK ARTHROSCOPY SHOULDER (MISCELLANEOUS) ×2 IMPLANT
PAD WRAPON POLAR SHDR XLG (MISCELLANEOUS) ×1 IMPLANT
PASSER SUT CAPTURE FIRST (SUTURE) ×2 IMPLANT
SET TUBE SUCT SHAVER OUTFL 24K (TUBING) ×2 IMPLANT
SET TUBE TIP INTRA-ARTICULAR (MISCELLANEOUS) ×2 IMPLANT
STRAP SAFETY BODY (MISCELLANEOUS) ×2 IMPLANT
STRIP CLOSURE SKIN 1/2X4 (GAUZE/BANDAGES/DRESSINGS) ×2 IMPLANT
SUT ETHILON 4-0 (SUTURE) ×1
SUT ETHILON 4-0 FS2 18XMFL BLK (SUTURE) ×1
SUT KNTLS 2.8 MAGNUM (Anchor) ×6 IMPLANT
SUT LASSO 90 DEG SD STR (SUTURE) IMPLANT
SUT MNCRL 4-0 (SUTURE) ×1
SUT MNCRL 4-0 27XMFL (SUTURE) ×1
SUT PDS AB 0 CT1 27 (SUTURE) ×2 IMPLANT
SUT PERFECTPASSER WHITE CART (SUTURE) ×4 IMPLANT
SUT SMART STITCH CARTRIDGE (SUTURE) ×4 IMPLANT
SUT VIC AB 0 CT1 36 (SUTURE) ×2 IMPLANT
SUT VIC AB 2-0 CT2 27 (SUTURE) ×2 IMPLANT
SUT VICRYL 0 AB UR-6 (SUTURE) ×2 IMPLANT
SUTURE ETHLN 4-0 FS2 18XMF BLK (SUTURE) ×1 IMPLANT
SUTURE MAGNUM WIRE 2X48 BLK (SUTURE) IMPLANT
SUTURE MNCRL 4-0 27XMF (SUTURE) ×1 IMPLANT
SYR 3ML LL SCALE MARK (SYRINGE) ×2 IMPLANT
SYRINGE 10CC LL (SYRINGE) ×2 IMPLANT
TAPE MICROFOAM 4IN (TAPE) ×2 IMPLANT
TUBING ARTHRO INFLOW-ONLY STRL (TUBING) ×2 IMPLANT
TUBING CONNECTING 10 (TUBING) ×2 IMPLANT
WAND HAND CNTRL MULTIVAC 90 (MISCELLANEOUS) ×2 IMPLANT
WRAPON POLAR PAD SHDR XLG (MISCELLANEOUS) ×2

## 2016-01-19 NOTE — H&P (Signed)
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 female who presents for preoperative history and physical with a diagnosis of M75.121 Complete rotatr-cuff tear/ruptr of r shoulder, not trauma. Patient is experiencing pain weakness and limitation of motion of the right upper extremity.   This is significantly impairing activities of daily living and her ability to perform at work.  She has elected for surgical management. MRI has confirmed a tear of rotator cuff.  Past Medical History:  Diagnosis Date  . Anxiety   . Bronchitis 11/2015  . Central obesity   . CHF (congestive heart failure) (HCC) 2007   ? history  . GERD (gastroesophageal reflux disease)   . Hyperlipidemia   . Hypertension   . Hypothyroidism   . Sleep disorder    Past Surgical History:  Procedure Laterality Date  . ABDOMINAL HYSTERECTOMY    . APPENDECTOMY    . BREAST BIOPSY Left 01/2008   neg  . COLONOSCOPY WITH PROPOFOL N/A 12/30/2015   Procedure: COLONOSCOPY WITH PROPOFOL;  Surgeon: Scot Junobert T Elliott, MD;  Location: [facility] ENDOSCOPY;  Service: Endoscopy;  Laterality: N/A;  . DERMOID CYST REMOVAL  3/209   Social History   Social History  . Marital status: Married    Spouse name: N/A  . Number of children: N/A  . Years of education: N/A   Social History Main Topics  . Smoking status: Never Smoker  . Smokeless tobacco: Never Used  . Alcohol use No  . Drug use: No  . Sexual activity: Not Asked   Other Topics Concern  . None   Social History Narrative  . None   Family History  Problem Relation Age of Onset  . Breast cancer Neg Hx    No Known Allergies Prior to Admission medications   Medication Sig Start Date End Date Taking? Authorizing Provider  acetaminophen (TYLENOL) 500 MG tablet Take 1,000 mg by mouth every 6 (six) hours as needed for mild pain or headache.   Yes Historical Provider, MD  albuterol (PROVENTIL  HFA;VENTOLIN HFA) 108 (90 Base) MCG/ACT inhaler Inhale into the lungs every 6 (six) hours as needed for wheezing or shortness of breath.   Yes Historical Provider, MD  ALPRAZolam Prudy Feeler(XANAX) 0.25 MG tablet Take 0.25 mg by mouth at bedtime as needed for anxiety or sleep.    Yes Historical Provider, MD  aspirin-acetaminophen-caffeine (EXCEDRIN MIGRAINE) [id number] MG tablet Take 2 tablets by mouth every 6 (six) hours as needed for headache.   Yes Historical Provider, MD  citalopram (CELEXA) 40 MG tablet Take 40 mg by mouth at bedtime.   Yes Historical Provider, MD  doxycycline (DORYX) 150 MG EC tablet Take 150 mg by mouth 2 (two) times daily. For 10 days.   Yes Historical Provider, MD  hydrochlorothiazide (HYDRODIURIL) 25 MG tablet Take 25 mg by mouth every morning.    Yes Historical Provider, MD  levothyroxine (SYNTHROID, LEVOTHROID) 50 MCG tablet Take 50 mcg by mouth daily before breakfast.   Yes Historical Provider, MD  Multiple Vitamins-Minerals (MULTIVITAMIN PO) Take 1 tablet by mouth at bedtime.   Yes Historical Provider, MD  omeprazole (PRILOSEC) 10 MG capsule Take 10 mg by mouth daily as needed (indigestion).    Yes Historical Provider, MD  rosuvastatin (CRESTOR) 20 MG tablet Take 20 mg by mouth at bedtime.   Yes Historical Provider, MD     Positive ROS: All other systems have been reviewed and were  otherwise negative with the exception of those mentioned in the HPI and as above.  Physical Exam: General: Alert, no acute distress Cardiovascular: Regular rate and rhythm, no murmurs rubs or gallops.  No pedal edema Respiratory: Clear to auscultation bilaterally, no wheezes rales or rhonchi. No cyanosis, no use of accessory musculature GI: No organomegaly, abdomen is soft and non-tender nondistended with positive bowel sounds. Skin: Skin intact, no lesions within the operative field. Neurologic: Sensation intact distally Psychiatric: Patient is competent for consent with normal mood and  affect Lymphatic: No cervical lymphadenopathy  MUSCULOSKELETAL: Right shoulder: Patient has pain with abduction and forward elevation to 90. She has pain with a downward directed force on her abducted shoulder. She has positive impingement signs. She has full digital wrist and elbow range of motion, intact sensation light touch in palpable radial pulse.  Assessment: M75.121 Complete rotatr-cuff tear/ruptr of r shoulder, not trauma  Plan: Plan for Procedure(s): RIGHT SHOULDER ARTHROSCOPY WITH OPEN ROTATOR CUFF REPAIR  I discussed the details of the operation with the patient as well as the postoperative course. She understands she'll be wearing an abduction sling for 4 weeks and begin physical therapy in 1 week. She understands she is not to forward elevate or abduct her arm actively for 8 weeks.  I discussed the risks and benefits of surgery with the patient and she.understands the risks include but are not limited to infection, bleeding requiring blood transfusion, nerve or blood vessel injury, joint stiffness or loss of motion, persistent pain, weakness or instability, malunion, nonunion and hardware failure and the need for further surgery. Medical risks include but are not limited to DVT and pulmonary embolism, myocardial infarction, stroke, pneumonia, respiratory failure and death. Patient understood these risks and wished to proceed.   Juanell FairlyKRASINSKI, Dago Jungwirth, MD   01/19/2016 7:33 AM

## 2016-01-19 NOTE — ED Triage Notes (Signed)
Pt in via triage with complaints of shortness of breath.  Pt with right rotator cuff repair today by Dr. Martha ClanKrasinski; pt reports some shortness of breath directly post-op, was instructed to take some slow deep breaths, O2 sats normal at that time and eventually discharged to home.  Pt reports ongoing shortness of breath since going home today with one episode feeling like she just could not catch her breath.  Pt 94% on room air per EMS; pt placed on 4L nasal cannula.  Pt A/Ox4, vitals WDL upon arrival, no immediate distress noted at this time.

## 2016-01-19 NOTE — Discharge Instructions (Signed)

## 2016-01-19 NOTE — Anesthesia Postprocedure Evaluation (Signed)
Anesthesia Post Note  Patient: Alexis GoodellLeslie P Leamer  Procedure(s) Performed: Procedure(s) (LRB): SHOULDER ARTHROSCOPY WITH OPEN ROTATOR CUFF REPAIR (Right)  Patient location during evaluation: PACU Anesthesia Type: General Level of consciousness: awake and alert Pain management: pain level controlled Vital Signs Assessment: post-procedure vital signs reviewed and stable Respiratory status: spontaneous breathing, nonlabored ventilation, respiratory function stable and patient connected to nasal cannula oxygen Cardiovascular status: blood pressure returned to baseline and stable Postop Assessment: no signs of nausea or vomiting Anesthetic complications: no    Last Vitals:  Vitals:   01/19/16 0741 01/19/16 1047  BP: (!) 128/91 132/67  Pulse: 68 73  Resp: 19 20  Temp:  36.4 C    Last Pain:  Vitals:   01/19/16 0628  TempSrc: Oral                 Yevette EdwardsJames G Adams

## 2016-01-19 NOTE — ED Notes (Signed)
Pt assisted to toilet in room  

## 2016-01-19 NOTE — ED Notes (Signed)
Patient transported to X-ray 

## 2016-01-19 NOTE — Anesthesia Procedure Notes (Signed)
Procedure Name: Intubation Performed by: Paiton Fosco Pre-anesthesia Checklist: Patient identified, Patient being monitored, Timeout performed, Emergency Drugs available and Suction available Patient Re-evaluated:Patient Re-evaluated prior to inductionOxygen Delivery Method: Circle system utilized Preoxygenation: Pre-oxygenation with 100% oxygen Intubation Type: IV induction Ventilation: Mask ventilation without difficulty Laryngoscope Size: Mac and 3 Grade View: Grade II Tube type: Oral Tube size: 7.0 mm Number of attempts: 1 Airway Equipment and Method: Stylet Placement Confirmation: ETT inserted through vocal cords under direct vision,  positive ETCO2 and breath sounds checked- equal and bilateral Secured at: 21 cm Tube secured with: Tape Dental Injury: Teeth and Oropharynx as per pre-operative assessment        

## 2016-01-19 NOTE — OR Nursing (Signed)
1300 Nauseated.  No vomiting.  Zofran given for nausea.

## 2016-01-19 NOTE — ED Notes (Signed)
Pt begins to cough causing her O2 to drop to 88-89% Pt sat up to a 90 degree angle. O2 increased to 91%.

## 2016-01-19 NOTE — Anesthesia Procedure Notes (Signed)
Anesthesia Regional Block:  Interscalene brachial plexus block  Pre-Anesthetic Checklist: ,, timeout performed, Correct Patient, Correct Site, Correct Laterality, Correct Procedure, Correct Position, site marked, Risks and benefits discussed,  Surgical consent,  Pre-op evaluation,  At surgeon's request and post-op pain management   Prep: Dura Prep       Needles:   Needle Type: Echogenic Stimulator Needle     Needle Length: 10cm 10 cm Needle Gauge: 21 and 21 G    Additional Needles:  Procedures: ultrasound guided (picture in chart) and nerve stimulator Interscalene brachial plexus block  Nerve Stimulator or Paresthesia:  Response: 100 mA, 3 cm  Additional Responses:   Narrative:  Injection made incrementally with aspirations every 5 mL.  Performed by: Personally  Anesthesiologist: Yevette EdwardsADAMS, JAMES G

## 2016-01-19 NOTE — OR Nursing (Signed)
Peppermint oil on cotton ball for nausea.  No vomiting.

## 2016-01-19 NOTE — H&P (Signed)
PREOPERATIVE H&P  Chief Complaint: Post-op shortness of breath  HPI: Judy Burnett is a 50 y.o. female who experienced postoperative shortness of breath at home. Patient was brought back to the emergency room by EMS. I have admitted the patient for monitoring of her breathing.  Patient states she has history of bronchitis in Sept/October.  She has a history of multiple episodes of bronchitis each year.  Past Medical History:  Diagnosis Date  . Anxiety   . Bronchitis 11/2015  . Central obesity   . CHF (congestive heart failure) (HCC) 2007   ? history  . GERD (gastroesophageal reflux disease)   . Hyperlipidemia   . Hypothyroidism   . Sleep disorder    Past Surgical History:  Procedure Laterality Date  . ABDOMINAL HYSTERECTOMY    . APPENDECTOMY    . BREAST BIOPSY Left 01/2008   neg  . COLONOSCOPY WITH PROPOFOL N/A 12/30/2015   Procedure: COLONOSCOPY WITH PROPOFOL;  Surgeon: Scot Junobert T Elliott, MD;  [facility] END[facility]COPY;  Service: Endoscopy;  Laterality: N/A;  . DERMOID CYST REMOVAL  3/209  . SHOULDER ARTHROSCOPY WITH OPEN ROTATOR CUFF REPAIR Right 01/19/2016   Procedure: SHOULDER ARTHROSCOPY WITH OPEN ROTATOR CUFF REPAIR;  Surgeon: Juanell FairlyKevin Jaylanni Eltringham, MD;  Location: [facility] ORS;  Service: Orthopedics;  Laterality: Right;   Social History   Social History  . Marital status: Married    Spouse name: N/A  . Number of children: N/A  . Years of education: N/A   Social History Main Topics  . Smoking status: Never Smoker  . Smokeless tobacco: Never Used  . Alcohol use No  . Drug use: No  . Sexual activity: Not Asked   Other Topics Concern  . None   Social History Narrative  . None   Family History  Problem Relation Age of Onset  . Breast cancer Neg Hx    No Known Allergies Prior to Admission medications   Medication Sig Start Date End Date Taking? Authorizing Provider  albuterol (PROVENTIL HFA;VENTOLIN HFA) 108 (90 Base) MCG/ACT inhaler Inhale into the lungs every 6 (six)  hours as needed for wheezing or shortness of breath.   Yes Historical Provider, MD  ALPRAZolam Prudy Feeler(XANAX) 0.25 MG tablet Take 0.25 mg by mouth at bedtime as needed for anxiety or sleep.    Yes Historical Provider, MD  aspirin-acetaminophen-caffeine (EXCEDRIN MIGRAINE) [id number] MG tablet Take 2 tablets by mouth every 6 (six) hours as needed for headache.   Yes Historical Provider, MD  citalopram (CELEXA) 40 MG tablet Take 40 mg by mouth at bedtime.   Yes Historical Provider, MD  hydrochlorothiazide (HYDRODIURIL) 25 MG tablet Take 25 mg by mouth every morning.    Yes Historical Provider, MD  levothyroxine (SYNTHROID, LEVOTHROID) 50 MCG tablet Take 50 mcg by mouth daily before breakfast.   Yes Historical Provider, MD  Multiple Vitamins-Minerals (MULTIVITAMIN PO) Take 1 tablet by mouth at bedtime.   Yes Historical Provider, MD  omeprazole (PRILOSEC) 10 MG capsule Take 10 mg by mouth daily as needed (indigestion).    Yes Historical Provider, MD  ondansetron (ZOFRAN) 4 MG tablet Take 1 tablet (4 mg total) by mouth every 8 (eight) hours as needed for nausea or vomiting. 01/19/16  Yes Juanell FairlyKevin Luciel Brickman, MD  oxyCODONE (OXY IR/ROXICODONE) 5 MG immediate release tablet Take 1-2 tablets (5-10 mg total) by mouth every 4 (four) hours as needed for severe pain. 01/19/16  Yes Juanell FairlyKevin Zennie Ayars, MD  rosuvastatin (CRESTOR) 20 MG tablet Take 20 mg by mouth at bedtime.  Yes Historical Provider, MD  acetaminophen (TYLENOL) 500 MG tablet Take 1,000 mg by mouth every 6 (six) hours as needed for mild pain or headache.    Historical Provider, MD  doxycycline (DORYX) 150 MG EC tablet Take 150 mg by mouth 2 (two) times daily. For 10 days.    Historical Provider, MD     Positive ROS: All other systems have been reviewed and were otherwise negative with the exception of those mentioned in the HPI and as above.  Physical Exam: General: Alert, no acute distress Cardiovascular: Regular rate and rhythm, no murmurs rubs or gallops.   No pedal edema Respiratory: Clear to auscultation bilaterally, no wheezes rales or rhonchi. No cyanosis, no use of accessory musculature.  No evidence of labored breathing. GI: No organomegaly, abdomen is soft and non-tender nondistended with positive bowel sounds. Neurologic: Sensation intact distally, mild paresthesias in the right hand.   Psychiatric: Patient with normal mood and affect Lymphatic: No cervical lymphadenopathy  MUSCULOSKELETAL: Right shoulder dressing remains clean dry and intact. She can flex and extend all 5 digits of the right hand. Her fingers are well-perfused and she has a palpable radial pulse. She has intact sensation to light touch but states that there are some residual paresthesias in her fingers.  Right upper extremity is in an abduction sling.  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 X-rays show elevation the right hemidiaphragm likely secondary to her interscalene nerve block for surgery earlier today.  A CT angiogram was also performed which showed no evidence of pulmonary embolism. Patient had no evidence of pneumonia.  Patient is receiving 2 L via nasal cannula with oxygen saturation of 97%. Patient's pain is well controlled currently. Continue sling for the right upper extremity.  I will order respiratory therapy for nebulizer treatments at patient's request.  I have consulted medicine for help with managing SOB.    Juanell FairlyKRASINSKI, Kerrilyn Azbill, MD   01/19/2016 10:52 PM

## 2016-01-19 NOTE — Anesthesia Preprocedure Evaluation (Addendum)
Anesthesia Evaluation  Patient identified by MRN, date of birth, ID band Patient awake    Reviewed: Allergy & Precautions, H&P , NPO status , Patient's Chart, lab work & pertinent test results, reviewed documented beta blocker date and time   Airway Mallampati: III  TM Distance: >3 FB Neck ROM: full    Dental  (+) Teeth Intact   Pulmonary neg pulmonary ROS,    Pulmonary exam normal        Cardiovascular Exercise Tolerance: Poor hypertension, +CHF  (-) Orthopnea, (-) PND and (-) DOE negative cardio ROS Normal cardiovascular exam Rhythm:regular Rate:Normal     Neuro/Psych Anxiety negative neurological ROS  negative psych ROS   GI/Hepatic negative GI ROS, Neg liver ROS, GERD  ,  Endo/Other  negative endocrine ROSHypothyroidism   Renal/GU negative Renal ROS  negative genitourinary   Musculoskeletal   Abdominal   Peds  Hematology negative hematology ROS (+)   Anesthesia Other Findings Past Medical History: No date: Anxiety 11/2015: Bronchitis No date: Central obesity 2007: CHF (congestive heart failure) (HCC)     Comment: ? history No date: GERD (gastroesophageal reflux disease) No date: Hyperlipidemia No date: Hypertension No date: Hypothyroidism No date: Sleep disorder Past Surgical History: No date: ABDOMINAL HYSTERECTOMY No date: APPENDECTOMY 01/2008: BREAST BIOPSY Left     Comment: neg 12/30/2015: COLONOSCOPY WITH PROPOFOL N/A     Comment: Procedure: COLONOSCOPY WITH PROPOFOL;                Surgeon: Scot Junobert T Elliott, MD;  Location[facility]               ENDOSCOPY;  Service: Endoscopy;  Laterality:               N/A; 3/209: DERMOID CYST REMOVAL   Reproductive/Obstetrics negative OB ROS                            Anesthesia Physical Anesthesia Plan  ASA: III  Anesthesia Plan: General ETT   Post-op Pain Management:  Regional for Post-op pain   Induction:  Intravenous  Airway Management Planned: Oral ETT  Additional Equipment:   Intra-op Plan:   Post-operative Plan:   Informed Consent: I have reviewed the patients History and Physical, chart, labs and discussed the procedure including the risks, benefits and alternatives for the proposed anesthesia with the patient or authorized representative who has indicated his/her understanding and acceptance.   Dental Advisory Given  Plan Discussed with: CRNA  Anesthesia Plan Comments:        Anesthesia Quick Evaluation

## 2016-01-19 NOTE — ED Provider Notes (Signed)
Time Seen: Approximately 1824  I have reviewed the triage notes  Chief Complaint: Shortness of Breath   History of Present Illness: Judy Burnett is a 50 y.o. female who had 2 separate episodes of significant shortness of breath postoperatively from a surgery on her right shoulder. Patient states in the postoperative area that she developed some shortness of breath and was placed on some supplemental oxygen therapy. She was advised to take some deep breaths and it sounds as though her pulse ox remained stable and she was discharged home. Patient had increased shortness of breath at home and states that she felt somewhat anxious. She denies any history of deep venous thrombosis or pulmonary embolism and her surgery was only approximately 2-1/2 hours long. No obvious complications from the anesthetic and sounds as though she had general anesthesia. She also received a nerve block of her right shoulder.   Past Medical History:  Diagnosis Date  . Anxiety   . Bronchitis 11/2015  . Central obesity   . CHF (congestive heart failure) (HCC) 2007   ? history  . GERD (gastroesophageal reflux disease)   . Hyperlipidemia   . Hypertension   . Hypothyroidism   . Sleep disorder     There are no active problems to display for this patient.   Past Surgical History:  Procedure Laterality Date  . ABDOMINAL HYSTERECTOMY    . APPENDECTOMY    . BREAST BIOPSY Left 01/2008   neg  . COLONOSCOPY WITH PROPOFOL N/A 12/30/2015   Procedure: COLONOSCOPY WITH PROPOFOL;  Surgeon: Scot Jun, MD;  Location: [facility] ENDOSCOPY;  Service: Endoscopy;  Laterality: N/A;  . DERMOID CYST REMOVAL  3/209  . SHOULDER ARTHROSCOPY WITH OPEN ROTATOR CUFF REPAIR Right 01/19/2016   Procedure: SHOULDER ARTHROSCOPY WITH OPEN ROTATOR CUFF REPAIR;  Surgeon: Juanell Fairly, MD;  Location: [facility] ORS;  Service: Orthopedics;  Laterality: Right;    Past Surgical History:  Procedure Laterality Date  . ABDOMINAL HYSTERECTOMY     . APPENDECTOMY    . BREAST BIOPSY Left 01/2008   neg  . COLONOSCOPY WITH PROPOFOL N/A 12/30/2015   Procedure: COLONOSCOPY WITH PROPOFOL;  Surgeon: Scot Jun, MD;  Location: [facility] ENDOSCOPY;  Service: Endoscopy;  Laterality: N/A;  . DERMOID CYST REMOVAL  3/209  . SHOULDER ARTHROSCOPY WITH OPEN ROTATOR CUFF REPAIR Right 01/19/2016   Procedure: SHOULDER ARTHROSCOPY WITH OPEN ROTATOR CUFF REPAIR;  Surgeon: Juanell Fairly, MD;  Location: [facility] ORS;  Service: Orthopedics;  Laterality: Right;    Current Outpatient Rx  . Order #: [id number] Class: Historical Med  . Order #: [id number] Class: Historical Med  . Order #: [id number] Class: Historical Med  . Order #: [id number] Class: Historical Med  . Order #: [id number] Class: Historical Med  . Order #: [id number] Class: Historical Med  . Order #: [id number] Class: Historical Med  . Order #: [id number] Class: Historical Med  . Order #: [id number] Class: Historical Med  . Order #: [id number] Class: Historical Med  . Order #: [id number] Class: Normal  . Order #: [id number] Class: Print  . Order #: [id number] Class: Historical Med    Allergies:  Patient has no known allergies.  Family History: Family History  Problem Relation Age of Onset  . Breast cancer Neg Hx     Social History: Social History  Substance Use Topics  . Smoking status: Never Smoker  . Smokeless tobacco: Never Used  . Alcohol use No     Review of Systems:   10 point review of systems was performed and was otherwise negative:  Constitutional:  No fever Eyes: No visual disturbances ENT: No sore throat, ear pain Cardiac: No Significant chest pain Respiratory: Shortness of breath Abdomen: Patient also describes some brief transient right lower quadrant abdominal pain which is now resolved. Endocrine: No weight loss, No night sweats Extremities: No peripheral edema, cyanosis Skin: No rashes, easy bruising Neurologic: No focal weakness, trouble with speech or swollowing Urologic:  No dysuria, Hematuria, or urinary frequency   Physical Exam:  ED Triage Vitals  Enc Vitals Group     BP 01/19/16 1830 111/80     Pulse Rate 01/19/16 1830 79     Resp 01/19/16 1830 20     Temp 01/19/16 1830 99.1 F (37.3 C)     Temp Source 01/19/16 1830 Oral     SpO2 01/19/16 1830 94 %     Weight 01/19/16 1831 190 lb (86.2 kg)     Height 01/19/16 1831 5\' 4"  (1.626 m)     Head Circumference --      Peak Flow --      Pain Score 01/19/16 1831 0     Pain Loc --      Pain Edu? --      Excl. in GC? --     General: Awake , Alert , and Oriented times 3; GCS 15 Somewhat anxious with no signs of respiratory distress Head: Normal cephalic , atraumatic Eyes: Pupils equal , round, reactive to light Nose/Throat: No nasal drainage, patent upper airway without erythema or exudate.  Neck: Supple, Full range of motion, No anterior adenopathy or palpable thyroid masses Lungs: Difficult exam since patient has her postoperative stabilization device on but seems to have diminished breath sounds primarily at the right base. Heart: Regular rate, regular rhythm without murmurs , gallops , or rubs Abdomen: Soft, non tender without rebound, guarding , or rigidity; bowel sounds positive and symmetric in all 4 quadrants. No organomegaly .        Extremities: 2 plus symmetric pulses. No edema, clubbing or cyanosis Neurologic: normal ambulation, Motor symmetric without deficits, sensory intact Skin: warm, dry, no rashes   Labs:   All laboratory work was reviewed including any pertinent negatives or positives listed below:  Labs Reviewed  URINALYSIS COMPLETEWITH MICROSCOPIC ([facility] ONLY)  Insert normal labs CBC BMP   Radiology  "Dg Chest 2 View  Result Date: 01/19/2016 CLINICAL DATA:  Worsening shortness of breath after shoulder surgery this morning. EXAM: CHEST  2 VIEW COMPARISON:  Chest radiograph October 08, 2014 FINDINGS: Limited lateral radiograph as patient was unable to elevate arm. Elevated  RIGHT hemidiaphragm new from prior radiograph. Mild bronchitic change without pleural effusion. No pneumothorax. RIGHT lung base strandy densities. Cardiomediastinal silhouette is normal. Suture anchor RIGHT humeral head. Mild degenerative change of the thoracic spine. IMPRESSION: Elevated RIGHT hemidiaphragm, new from prior radiograph with RIGHT lung base atelectasis. Mild bronchitic changes. Electronically Signed   By: Awilda Metroourtnay  Bloomer M.D.   On: 01/19/2016 19:41   Ct Angio Chest Pe W Or Wo Contrast  Result Date: 01/19/2016 CLINICAL DATA:  Shortness of breath. Status post right rotator cuff repair today. EXAM: CT ANGIOGRAPHY CHEST WITH CONTRAST TECHNIQUE: Multidetector CT imaging of the chest was performed using the standard protocol during bolus administration of intravenous contrast. Multiplanar CT image reconstructions and MIPs were obtained to evaluate the vascular anatomy. CONTRAST:  75 cc Isovue 370 COMPARISON:  Chest radiographs obtained earlier today. FINDINGS: Cardiovascular: Satisfactory opacification of the pulmonary arteries to the segmental level. No evidence of pulmonary embolism. Normal  heart size. No pericardial effusion. Mediastinum/Nodes: No enlarged mediastinal, hilar, or axillary lymph nodes. Thyroid gland, trachea, and esophagus demonstrate no significant findings. Lungs/Pleura: Right lower lobe atelectasis. Right upper lobe atelectasis. No pleural fluid. Upper Abdomen: Elevated right hemidiaphragm. Musculoskeletal: Right shoulder soft tissue air and fixation anchors compatible with the history of surgery today. Thoracic spine degenerative changes. Review of the MIP images confirms the above findings. IMPRESSION: 1. No pulmonary emboli. 2. Right lower lobe and right upper lobe atelectasis. 3. Elevated right hemidiaphragm. 4. Right shoulder postoperative changes. Electronically Signed   By: Beckie SaltsSteven  Reid M.D.   On: 01/19/2016 21:17  "    I personally reviewed the radiologic  studies    ED Course: Patient's stay here was uneventful and she was slightly improved with IV Ativan along with a DuoNeb. She likely has an elevated hemidiaphragm due to her scalene block on the right. Her differential outside of the elevated diaphragm included atelectasis, mucous plug, pulmonary embolism, pneumonia. Patient does not appear to have a pulmonary embolism etc.  Clinical Course      Assessment:  Postoperative shortness of breath Right elevated hemidiaphragm    Plan:  Inpatient            Jennye MoccasinBrian S Renu Asby, MD 01/19/16 2128

## 2016-01-19 NOTE — Op Note (Signed)
01/19/2016  11:14 AM  PATIENT:  Judy Burnett  50 y.o. 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 female with a full-thickness rotator cuff tear confirmed by MRI who failed conservative measures and elected for surgical management.    The risks benefits and alternatives were discussed with the patient preoperatively including but not limited to the risks of infection, bleeding, nerve injury, persistent pain or weakness, failure of the hardware, re-tear of the rotator cuff and the need for further surgery. Medical risks include DVT and pulmonary embolism, myocardial infarction, stroke, pneumonia, respiratory failure and death. Patient understood these risks and wished to proceed.  OPERATIVE IMPLANTS: ArthroCare Magnum 2 anchors x 3 & Smith and Nephew Q Fix anchors x 1  OPERATIVE FINDINGS: Full-thickness rotator cuff involving the supra spinatus and anterior fibers of the infraspinatus.  OPERATIVE PROCEDURE: The patient was met in the preoperative area. The right shoulder was signed with the word yes and my initials according the hospital's correct site of surgery protocol.  An interscalene block was placed on the right side by the anesthesia service. A preoperative history and physical was performed. Patient was brought to the operating room where she underwent general endotracheal intubation. The patient was placed in a beachchair position.  A spider arm positioner was used for this case. Examination under anesthesia revealed no limitation of motion or instability with load shift testing. The patient had a  negative sulcus sign.  Patient was prepped and draped in a sterile fashion. A timeout was performed to verify the patient's name, date of birth, medical record number, correct site of surgery and correct procedure to be performed there was also used to verify the patient received antibiotics that all appropriate instruments, implants and radiographs studies were available in the room. Once all in attendance were in agreement case began.  Bony landmarks were drawn out with a surgical marker along with proposed arthroscopy incisions. These were pre-injected with 1% lidocaine plain. An 11 blade was used to establish a posterior portal through which the arthroscope was placed in the glenohumeral joint. A full diagnostic examination of the shoulder was performed.  Patient was found to have a tear of the supraspinatus but there is no evidence of labral tear, no biceps tendon tear and no chondral injury of the glenoid or humeral Burnett.. The fraying of the supraspinatus was debrided with a 4.0 resector shaver blade. An 18-gauge spinal needle was used to place a 0 PDS suture through the rotator cuff tear for identification from the bursal side.  The arthroscope was then placed in the subacromial space. Extensive bursitis was encountered and debrided using a 4-0 resector shaver blade and a 90 ArthroCare wand from a lateral portal which was established under direct visualization using an 18-gauge spinal needle. A subacromial decompression was also performed using a 5.5 mm resector shaver blade from the lateral portal. The resector was then placed through the anterior portal and the distal clavicle excision was performed. The 0 PDS suture was identified. The rotator cuff  tear was found to be more extensive on the bursal side.  The torn lateral edge of  the rotator cuff was debrided with a 4.0 resector shaver blade. A 5.5 mm resector shaver blade was then used to debride the greater tuberosity of all torn fibers of the  rotator cuff.   Two Perfect Pass suture were placed in the lateral border of the rotator cuff tear.  All arthroscopic instruments were then removed and the mini-open portion of the procedure began.  Arthroscopic images of the subacromial decompression, distal clavicle excision and suture placement were taken. All arthroscopic instruments were then removed.  A saber-type incision was made along the lateral border of the acromion. The deltoid muscle was identified and split in line with its fibers which allowed visualization of the rotator cuff. The Perfect Pass sutures previously placed in the lateral border of the rotator cuff were brought out through the deltoid split.  A single [employer] anchor was placed at the articular margin of the humeral Burnett with the greater tuberosity. The suture limbs of the Q Fix anchor were passed medially through the rotator cuff using a First Pass suture passer.   A third perfect Pass suture was placed on lateral border of the rotator cuff.   The three Perfect Pass sutures were then anchored to the greater tuberosity footprint using three Magnum 2 anchors. These anchors were tensioned to allow for anatomic reduction of the rotator cuff to the greater tuberosity. The medial row sutures were then tied down using an arthroscopic knot tying technique.  Arthroscopic images of the repair were taken with the arthroscope both externally and from inside the glenohumeral joint.  All incisions were copiously irrigated. The deltoid fascia was repaired using a 0 Vicryl suture.  The subcutaneous tissue of all incisions were closed with a 2-0 Vicryl. Skin closure for the arthroscopic incisions was performed with 4-0 nylon. The skin edges of the saber incision was approximated with a running 4-0 undyed Monocryl.  0.25% Marcaine plain was then injected into the subacromial space for postoperative pain control. A dry sterile dressing was applied.  The patient was placed in an abduction  sling, with a Polar Care sleeve.  All sharp and it instrument counts were correct at the conclusion of the case. I was scrubbed and present for the entire case. I spoke with the patient's husband postoperatively to let him know the case had gone without complication and the patient was stable in recovery room.   Timoteo Gaul, MD

## 2016-01-19 NOTE — ED Notes (Signed)
Pt back from CT

## 2016-01-19 NOTE — Transfer of Care (Signed)
Immediate Anesthesia Transfer of Care Note  Patient: Alexis GoodellLeslie P Burnett  Procedure(s) Performed: Procedure(s): SHOULDER ARTHROSCOPY WITH OPEN ROTATOR CUFF REPAIR (Right)  Patient Location: PACU  Anesthesia Type:General  Level of Consciousness: sedated  Airway & Oxygen Therapy: Patient Spontanous Breathing and Patient connected to face mask oxygen  Post-op Assessment: Report given to RN and Post -op Vital signs reviewed and stable  Post vital signs: Reviewed and stable  Last Vitals:  Vitals:   01/19/16 0731 01/19/16 0741  BP: 128/80 (!) 128/91  Pulse: 69 68  Resp: (!) 23 19  Temp:      Last Pain:  Vitals:   01/19/16 0628  TempSrc: Oral         Complications: No apparent anesthesia complications

## 2016-01-20 MED ORDER — CELECOXIB 200 MG PO CAPS
200.0000 mg | ORAL_CAPSULE | Freq: Two times a day (BID) | ORAL | Status: DC
  Administered 2016-01-20 – 2016-01-21 (×2): 200 mg via ORAL
  Filled 2016-01-20 (×2): qty 1

## 2016-01-20 MED ORDER — IBUPROFEN 400 MG PO TABS
400.0000 mg | ORAL_TABLET | ORAL | Status: DC | PRN
  Administered 2016-01-21: 400 mg via ORAL
  Filled 2016-01-20: qty 1

## 2016-01-20 MED ORDER — HYDROCODONE-ACETAMINOPHEN 7.5-325 MG PO TABS
1.0000 | ORAL_TABLET | ORAL | Status: DC | PRN
  Administered 2016-01-20: 1 via ORAL
  Administered 2016-01-21: 2 via ORAL
  Administered 2016-01-21: 1 via ORAL
  Administered 2016-01-21: 2 via ORAL
  Filled 2016-01-20: qty 2
  Filled 2016-01-20: qty 1
  Filled 2016-01-20: qty 2
  Filled 2016-01-20: qty 1
  Filled 2016-01-20: qty 2

## 2016-01-20 MED ORDER — HYDROMORPHONE HCL 1 MG/ML IJ SOLN
1.0000 mg | INTRAMUSCULAR | Status: DC | PRN
  Administered 2016-01-20: 1 mg via INTRAVENOUS
  Filled 2016-01-20: qty 1

## 2016-01-20 MED ORDER — ACETAMINOPHEN 325 MG PO TABS: 650.0000 mg | ORAL_TABLET | Freq: Four times a day (QID) | ORAL | Status: DC | PRN

## 2016-01-20 NOTE — Progress Notes (Signed)
Consult noted for respiratory distress post op 01/19/2016.  Patient with o2 in use at 2liters. Saturation noted at 96 HR 80's RR 22. Patient has been treated recently for bronchitis via antibiotics and albuterol inhaler. States what sputum she produces now is essentially clear.  BBS at this time noted to be diminished in bases with some rhonci-upper lobes that clear with cough.  Albuterol nebulizer given and ordered every 4 hours as needed. Strongly encouraged patient to continue with IS therapy.  Not performed at this time however due to nausea and headache.  SOB may be contributed to recently bout of bronchitis in addition to post surgery pain and any weight bearing stabilization devices patient may have post surgery.  Discussed my findings with Dr Martha ClanKrasinski and he was ok with prn svn treatments.  RN aware as well.

## 2016-01-20 NOTE — Evaluation (Signed)
Physical Therapy Evaluation Patient Details Name: Judy Burnett MRN: [id number] DOB: November 22, 1965 Today's Date: 01/20/2016   History of Present Illness  Pt is a 50 yo F s/p R shoulder open RTC repair on 01/19/16.  Pt discharged home same day and returned 01/19/16 secondary to SOB.  Clinical Impression  Pt required min A with bed mobility secondary to RUE NWB status and in abd sling.  Pt ind with transfers and ambulated 220' without AD independently on 1LO2/min with SpO2 95% and no SOB or instability.  Will discharge pt from PT services and complete orders at this time but will re-assess if there is a status change and new orders received.    Follow Up Recommendations No PT follow up (PT already set up for RTC repair rehab)    Equipment Recommendations  None recommended by PT    Recommendations for Other Services       Precautions / Restrictions Precautions Precautions: Shoulder Type of Shoulder Precautions: RUE RTC repair, NWB RUE, abd sling donned Shoulder Interventions: Shoulder sling/immobilizer;Shoulder abduction pillow;At all times Restrictions Weight Bearing Restrictions: Yes RUE Weight Bearing: Non weight bearing      Mobility  Bed Mobility Overal bed mobility: Needs Assistance Bed Mobility: Supine to Sit;Sit to Supine     Supine to sit: Min assist Sit to supine: Min assist   General bed mobility comments: Min A with bed mobility secondary RUE in sling and NWB  Transfers Overall transfer level: Independent Equipment used: None                Ambulation/Gait Ambulation/Gait assistance: Independent Ambulation Distance (Feet): 220 Feet Assistive device: None Gait Pattern/deviations: WFL(Within Functional Limits)   Gait velocity interpretation: Below normal speed for age/gender General Gait Details: Pt steady with gait without AD including turns and start/stops  Stairs            Wheelchair Mobility    Modified Rankin (Stroke Patients Only)        Balance Overall balance assessment: Independent (Pt steady in standing with feet together, eyes closed, with head turns)                                           Pertinent Vitals/Pain      Home Living Family/patient expects to be discharged to:: Private residence Living Arrangements: Spouse/significant other Available Help at Discharge: Family Type of Home: House Home Access: Stairs to enter Entrance Stairs-Rails: Right;Left;Can reach both Secretary/administratorntrance Stairs-Number of Steps: 2 Home Layout: One level Home Equipment: None      Prior Function Level of Independence: Independent         Comments: Ind amb community distances without AD, Ind with all ADLs, FT school teacher, 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     Hand Dominance   Dominant Hand: Right    Extremity/Trunk Assessment   Upper Extremity Assessment: Defer to OT evaluation           Lower Extremity Assessment: Overall WFL for tasks assessed         Communication   Communication: No difficulties  Cognition Arousal/Alertness: Awake/alert Behavior During Therapy: WFL for tasks assessed/performed Overall Cognitive Status: Within Functional Limits for tasks assessed                      General Comments      Exercises     Assessment/Plan  PT Assessment Patent does not need any further PT services  PT Problem List            PT Treatment Interventions      PT Goals (Current goals can be found in the Care Plan section)       Frequency     Barriers to discharge        Co-evaluation               End of Session Equipment Utilized During Treatment: Gait belt Activity Tolerance: Patient tolerated treatment well Patient left: in bed;with family/visitor present;with call bell/phone within reach Nurse Communication: Mobility status (SpO2 reading of 95% after amb on 1LO2/min)    Functional Limitation: Mobility: Walking and moving around Mobility: Walking and Moving  Around Current Status ([id number]): At least 1 percent but less than 20 percent impaired, limited or restricted Mobility: Walking and Moving Around Goal Status [phone]([id number]): At least 1 percent but less than 20 percent impaired, limited or restricted Mobility: Walking and Moving Around Discharge Status [phone]([id number]): At least 1 percent but less than 20 percent impaired, limited or restricted    Time: 1140-1215 PT Time Calculation (min) (ACUTE ONLY): 35 min   Charges:   PT Evaluation $PT Eval Low Complexity: 1 Procedure     PT G Codes:   PT G-Codes **NOT FOR INPATIENT CLASS** Functional Limitation: Mobility: Walking and moving around Mobility: Walking and Moving Around Current Status ([id number]): At least 1 percent but less than 20 percent impaired, limited or restricted Mobility: Walking and Moving Around Goal Status [phone]([id number]): At least 1 percent but less than 20 percent impaired, limited or restricted Mobility: Walking and Moving Around Discharge Status [phone]([id number]): At least 1 percent but less than 20 percent impaired, limited or restricted    D. Scott Jolly Carlini PT, DPT 01/20/16, 1:12 PM

## 2016-01-20 NOTE — Progress Notes (Signed)
Subjective:  POD #1 s/p right shoulder rotator cuff repair complicated by SOB after right hemidiaphragm following interscalene block.  Patient reports pain as marked.  Patient states that morphine and oxycodone are not controlling her pain. Patient has experienced improvement in her breathing although she does not feel that her breathing has yet returned to its baseline. Her husband is at the bedside.  Objective:   VITALS:   Vitals:   01/20/16 0418 01/20/16 0758 01/20/16 1259 01/20/16 1445  BP: 113/62 (!) 149/73  121/70  Pulse: 88 66 76 73  Resp: 18 18  18   Temp: 97.8 F (36.6 C) 97.9 F (36.6 C)  98.5 F (36.9 C)  TempSrc: Oral Oral  Oral  SpO2: 100% 99% 98% 93%  Weight:      Height:        PHYSICAL EXAM:  Gen.: Patient alert oriented and in no acute distress. Patient is lying flat in her bed with pillows under the right shoulder. She is wearing an abduction sling on the right upper extremity.  Right upper extremity: Patient's dressing remains clean dry and intact. She has intact sensation to light touch throughout the right upper extremity. She can flex and extend her fingers. She has a palpable radial pulse and her fingers are well-perfused.    LABS  Results for orders placed or performed during the hospital encounter of 01/19/16 (from the past 24 hour(s))  Urinalysis complete, with microscopic ([facility] only)     Status: Abnormal   Collection Time: 01/19/16  7:24 PM  Result Value Ref Range   Color, Urine STRAW (A) YELLOW   APPearance CLEAR (A) CLEAR   Glucose, UA NEGATIVE NEGATIVE mg/dL   Bilirubin Urine NEGATIVE NEGATIVE   Ketones, ur NEGATIVE NEGATIVE mg/dL   Specific Gravity, Urine 1.009 1.005 - 1.030   Hgb urine dipstick NEGATIVE NEGATIVE   pH 6.0 5.0 - 8.0   Protein, ur NEGATIVE NEGATIVE mg/dL   Nitrite NEGATIVE NEGATIVE   Leukocytes, UA NEGATIVE NEGATIVE   RBC / HPF NONE SEEN 0 - 5 RBC/hpf   WBC, UA 0-5 0 - 5 WBC/hpf   Bacteria, UA NONE SEEN NONE SEEN    Squamous Epithelial / LPF 0-5 (A) NONE SEEN   Mucous PRESENT   Basic metabolic panel     Status: Abnormal   Collection Time: 01/19/16  8:06 PM  Result Value Ref Range   Sodium 138 135 - 145 mmol/L   Potassium 3.4 (L) 3.5 - 5.1 mmol/L   Chloride 102 101 - 111 mmol/L   CO2 27 22 - 32 mmol/L   Glucose, Bld 142 (H) 65 - 99 mg/dL   BUN 7 6 - 20 mg/dL   Creatinine, Ser 1.610.65 0.44 - 1.00 mg/dL   Calcium 09.610.2 8.9 - 04.510.3 mg/dL   GFR calc non Af Amer >60 >60 mL/min   GFR calc Af Amer >60 >60 mL/min   Anion gap 9 5 - 15  CBC with Differential/Platelet     Status: Abnormal   Collection Time: 01/19/16  8:06 PM  Result Value Ref Range   WBC 17.6 (H) 3.6 - 11.0 K/uL   RBC 4.55 3.80 - 5.20 MIL/uL   Hemoglobin 12.8 12.0 - 16.0 g/dL   HCT 40.938.4 81.135.0 - 91.447.0 %   MCV 84.4 80.0 - 100.0 fL   MCH 28.2 26.0 - 34.0 pg   MCHC 33.4 32.0 - 36.0 g/dL   RDW 78.213.4 95.611.5 - 21.314.5 %   Platelets 272 150 - 440 K/uL  Neutrophils Relative % 86 %   Neutro Abs 15.2 (H) 1.4 - 6.5 K/uL   Lymphocytes Relative 10 %   Lymphs Abs 1.7 1.0 - 3.6 K/uL   Monocytes Relative 3 %   Monocytes Absolute 0.6 0.2 - 0.9 K/uL   Eosinophils Relative 0 %   Eosinophils Absolute 0.0 0 - 0.7 K/uL   Basophils Relative 1 %   Basophils Absolute 0.1 0 - 0.1 K/uL    Dg Chest 2 View  Result Date: 01/19/2016 CLINICAL DATA:  Worsening shortness of breath after shoulder surgery this morning. EXAM: CHEST  2 VIEW COMPARISON:  Chest radiograph October 08, 2014 FINDINGS: Limited lateral radiograph as patient was unable to elevate arm. Elevated RIGHT hemidiaphragm new from prior radiograph. Mild bronchitic change without pleural effusion. No pneumothorax. RIGHT lung base strandy densities. Cardiomediastinal silhouette is normal. Suture anchor RIGHT humeral head. Mild degenerative change of the thoracic spine. IMPRESSION: Elevated RIGHT hemidiaphragm, new from prior radiograph with RIGHT lung base atelectasis. Mild bronchitic changes. Electronically Signed    By: Awilda Metroourtnay  Bloomer M.D.   On: 01/19/2016 19:41   Ct Angio Chest Pe W Or Wo Contrast  Result Date: 01/19/2016 CLINICAL DATA:  Shortness of breath. Status post right rotator cuff repair today. EXAM: CT ANGIOGRAPHY CHEST WITH CONTRAST TECHNIQUE: Multidetector CT imaging of the chest was performed using the standard protocol during bolus administration of intravenous contrast. Multiplanar CT image reconstructions and MIPs were obtained to evaluate the vascular anatomy. CONTRAST:  75 cc Isovue 370 COMPARISON:  Chest radiographs obtained earlier today. FINDINGS: Cardiovascular: Satisfactory opacification of the pulmonary arteries to the segmental level. No evidence of pulmonary embolism. Normal heart size. No pericardial effusion. Mediastinum/Nodes: No enlarged mediastinal, hilar, or axillary lymph nodes. Thyroid gland, trachea, and esophagus demonstrate no significant findings. Lungs/Pleura: Right lower lobe atelectasis. Right upper lobe atelectasis. No pleural fluid. Upper Abdomen: Elevated right hemidiaphragm. Musculoskeletal: Right shoulder soft tissue air and fixation anchors compatible with the history of surgery today. Thoracic spine degenerative changes. Review of the MIP images confirms the above findings. IMPRESSION: 1. No pulmonary emboli. 2. Right lower lobe and right upper lobe atelectasis. 3. Elevated right hemidiaphragm. 4. Right shoulder postoperative changes. Electronically Signed   By: Beckie SaltsSteven  Reid M.D.   On: 01/19/2016 21:17    Assessment/Plan:     Active Problems:   Shortness of breath  Patient has had improvement in her breathing. Pain still remains a significant issue for her. I'm going to change her from morphine to Dilaudid and try hydrocodone instead of oxycodone. Patient will stay overnight so her pain can be managed. Patient's breathing will continue to be monitored. I spoke with one the anesthesiologist who states that the right hemidiaphragm should recover as the interscalene  block wears off. The patient is having any issues with breathing tomorrow I will order another chest x-ray.    Juanell FairlyKRASINSKI, Wilkes Potvin , MD 01/20/2016, 4:42 PM

## 2016-01-20 NOTE — Progress Notes (Signed)
OT Cancellation Note  Patient Details Name: Alexis GoodellLeslie P Givan MRN: [id number] DOB: Mar 06, 1965   Cancelled Treatment:    Reason Eval/Treat Not Completed: OT screened, no needs identified, will sign off Order received and chart reviewed.  Pt will have help from her mother and is not in need of any training in AD for ADLs.  Reviewed safe set up for bathroom at home which is already in place.  No OT needs and pt seen for screening only.  Thank you for the referral.  Susanne BordersSusan Wofford, OTR/L ascom [phone] 01/20/16, 3:23 PM

## 2016-01-21 MED ORDER — HYDROCODONE-ACETAMINOPHEN 7.5-325 MG PO TABS: 1.0000 | ORAL_TABLET | ORAL | 0 refills | Status: DC | PRN

## 2016-01-21 NOTE — Progress Notes (Signed)
Patient discharge summary reviewed with verbal understanding. 1 narcotic RX given upon discharge. Escorted by ortho staff via wc to personal vehicle.

## 2016-01-21 NOTE — OR Nursing (Signed)
Subjective:  POD#2  S/p right shoulder rotator cuff tear with post-op SOB.  Patient reports pain as mild to moderate.  Patient's breathing is improved today. Her husband is at the bedside.  Objective:   VITALS:   Vitals:   01/20/16 1445 01/20/16 2003 01/21/16 0405 01/21/16 0746  BP: 121/70 123/65 110/67 127/66  Pulse: 73 69 63 (!) 56  Resp: 18 19 18 18   Temp: 98.5 F (36.9 C) 98.7 F (37.1 C) 97.9 F (36.6 C) 97.6 F (36.4 C)  TempSrc: Oral Oral Oral Oral  SpO2: 93% 100% 95% 100%  Weight:      Height:        PHYSICAL EXAM:  Gen. appearance: Patient is lying in bed. She is in no acute distress. Patient has no labored breathing.  Right shoulder: Patient's bandages and tacked and is clean and dry. She has intact sensation light touch throughout the right upper extremity. She is a palpable radial pulse. She can flex and extend all 5 digits of the right hand and has full wrist range of motion. Patient has an abduction sling in place.   LABS  No results found for this or any previous visit (from the past 24 hour(s)).  Dg Chest 2 View  Result Date: 01/19/2016 CLINICAL DATA:  Worsening shortness of breath after shoulder surgery this morning. EXAM: CHEST  2 VIEW COMPARISON:  Chest radiograph October 08, 2014 FINDINGS: Limited lateral radiograph as patient was unable to elevate arm. Elevated RIGHT hemidiaphragm new from prior radiograph. Mild bronchitic change without pleural effusion. No pneumothorax. RIGHT lung base strandy densities. Cardiomediastinal silhouette is normal. Suture anchor RIGHT humeral head. Mild degenerative change of the thoracic spine. IMPRESSION: Elevated RIGHT hemidiaphragm, new from prior radiograph with RIGHT lung base atelectasis. Mild bronchitic changes. Electronically Signed   By: Awilda Metroourtnay  Bloomer M.D.   On: 01/19/2016 19:41   Ct Angio Chest Pe W Or Wo Contrast  Result Date: 01/19/2016 CLINICAL DATA:  Shortness of breath. Status post right rotator cuff  repair today. EXAM: CT ANGIOGRAPHY CHEST WITH CONTRAST TECHNIQUE: Multidetector CT imaging of the chest was performed using the standard protocol during bolus administration of intravenous contrast. Multiplanar CT image reconstructions and MIPs were obtained to evaluate the vascular anatomy. CONTRAST:  75 cc Isovue 370 COMPARISON:  Chest radiographs obtained earlier today. FINDINGS: Cardiovascular: Satisfactory opacification of the pulmonary arteries to the segmental level. No evidence of pulmonary embolism. Normal heart size. No pericardial effusion. Mediastinum/Nodes: No enlarged mediastinal, hilar, or axillary lymph nodes. Thyroid gland, trachea, and esophagus demonstrate no significant findings. Lungs/Pleura: Right lower lobe atelectasis. Right upper lobe atelectasis. No pleural fluid. Upper Abdomen: Elevated right hemidiaphragm. Musculoskeletal: Right shoulder soft tissue air and fixation anchors compatible with the history of surgery today. Thoracic spine degenerative changes. Review of the MIP images confirms the above findings. IMPRESSION: 1. No pulmonary emboli. 2. Right lower lobe and right upper lobe atelectasis. 3. Elevated right hemidiaphragm. 4. Right shoulder postoperative changes. Electronically Signed   By: Beckie SaltsSteven  Reid M.D.   On: 01/19/2016 21:17    Assessment/Plan: @RRDAYSPOSTSURGERY @   Active Problems:   Shortness of breath  Patient is doing well postop. Her breathing is improved. She has a history of bronchitis and will follow up with her PCP regarding treatment of this. Patient had a right hemidiaphragm following surgery from her interscalene block which seems to be improving. Patient's pain is improved on Norco.    Patient be discharged home today. She'll follow up with me in 1  week. She is to begin outpatient physical therapy next Tuesday.    Juanell FairlyKRASINSKI, Monia Timmers , MD 01/21/2016, 12:45 PM

## 2016-01-21 NOTE — OR Nursing (Signed)
Physician Discharge Summary  Patient ID: Judy GoodellLeslie P Burnett MRN: [id number] DOB/AGE: [birth year] 50 y.o.  Admit date: 01/19/2016 Discharge date: 01/21/2016  Admission Diagnoses:  S/p right shoulder rotator cuff repair Post-op shortness of breath  Discharge Diagnoses:  S/p right shoulder rotator cuff repair Post-op shortness of breath secondary to hemidiaphragm caused by interscalene block  Past Medical History:  Diagnosis Date  . Anxiety   . Bronchitis 11/2015  . Central obesity   . CHF (congestive heart failure) (HCC) 2007   ? history  . GERD (gastroesophageal reflux disease)   . Hyperlipidemia   . Hypothyroidism   . Sleep disorder     Surgeries:  Patient underwent an uncomplicated right shoulder mini open rotator cuff repair on 01/19/2016.   Consultants (if any): Respiratory therapy  Discharged Condition: Improved  Hospital Course: Judy GoodellLeslie P Flink is an 50 y.o. female who was admitted 01/19/2016 with a diagnosis of  Post-op shortness of breath after undergoing an elective right shoulder mini open rotator cuff repair. Patient was admitted via the emergency room after she eats. Shortness of breath at home. Chest x-ray demonstrated a right hemidiaphragm which was secondary to the patient's right interscalene block. A CT angiogram was also performed which showed no evidence of pulmonary embolism.  Patient was given nebulizer treatment by respiratory therapy. He is placed on supplemental O2 for support as her breathing recovered.  Patient suffered from postoperative pain which was managed with IV and oral analgesics.  She was given TED stockings, and early ambulation for DVT prophylaxis.  She benefited maximally from the hospital stay and there were no complications.  The patient's clinical improvement she is prepared for discharge on postop day #2.  Recent vital signs:  Vitals:   01/21/16 0405 01/21/16 0746  BP: 110/67 127/66  Pulse: 63 (!) 56  Resp: 18 18  Temp: 97.9 F (36.6  C) 97.6 F (36.4 C)    Recent laboratory studies:  Lab Results  Component Value Date   HGB 12.8 01/19/2016   HGB 13.0 01/10/2016   HGB 14.1 10/08/2014   Lab Results  Component Value Date   WBC 17.6 (H) 01/19/2016   PLT 272 01/19/2016   Lab Results  Component Value Date   INR 0.97 01/10/2016   Lab Results  Component Value Date   NA 138 01/19/2016   K 3.4 (L) 01/19/2016   CL 102 01/19/2016   CO2 27 01/19/2016   BUN 7 01/19/2016   CREATININE 0.65 01/19/2016   GLUCOSE 142 (H) 01/19/2016    Discharge Medications:     Medication List    STOP taking these medications   acetaminophen 500 MG tablet Commonly known as:  TYLENOL   aspirin-acetaminophen-caffeine 250-250-65 MG tablet Commonly known as:  EXCEDRIN MIGRAINE   oxyCODONE 5 MG immediate release tablet Commonly known as:  Oxy IR/ROXICODONE     TAKE these medications   albuterol 108 (90 Base) MCG/ACT inhaler Commonly known as:  PROVENTIL HFA;VENTOLIN HFA Inhale into the lungs every 6 (six) hours as needed for wheezing or shortness of breath.   ALPRAZolam 0.25 MG tablet Commonly known as:  XANAX Take 0.25 mg by mouth at bedtime as needed for anxiety or sleep.   citalopram 40 MG tablet Commonly known as:  CELEXA Take 40 mg by mouth at bedtime.   doxycycline 150 MG EC tablet Commonly known as:  DORYX Take 150 mg by mouth 2 (two) times daily. For 10 days.   hydrochlorothiazide 25 MG tablet Commonly known as:  HYDRODIURIL Take 25 mg by mouth every morning.   HYDROcodone-acetaminophen 7.5-325 MG tablet Commonly known as:  NORCO Take 1-2 tablets by mouth every 4 (four) hours as needed for moderate pain.   levothyroxine 50 MCG tablet Commonly known as:  SYNTHROID, LEVOTHROID Take 50 mcg by mouth daily before breakfast.   MULTIVITAMIN PO Take 1 tablet by mouth at bedtime.   omeprazole 10 MG capsule Commonly known as:  PRILOSEC Take 10 mg by mouth daily as needed (indigestion).   ondansetron 4 MG  tablet Commonly known as:  ZOFRAN Take 1 tablet (4 mg total) by mouth every 8 (eight) hours as needed for nausea or vomiting.   rosuvastatin 20 MG tablet Commonly known as:  CRESTOR Take 20 mg by mouth at bedtime.       Diagnostic Studies: Dg Chest 2 View  Result Date: 01/19/2016 CLINICAL DATA:  Worsening shortness of breath after shoulder surgery this morning. EXAM: CHEST  2 VIEW COMPARISON:  Chest radiograph October 08, 2014 FINDINGS: Limited lateral radiograph as patient was unable to elevate arm. Elevated RIGHT hemidiaphragm new from prior radiograph. Mild bronchitic change without pleural effusion. No pneumothorax. RIGHT lung base strandy densities. Cardiomediastinal silhouette is normal. Suture anchor RIGHT humeral head. Mild degenerative change of the thoracic spine. IMPRESSION: Elevated RIGHT hemidiaphragm, new from prior radiograph with RIGHT lung base atelectasis. Mild bronchitic changes. Electronically Signed   By: Awilda Metro M.D.   On: 01/19/2016 19:41   Ct Angio Chest Pe W Or Wo Contrast  Result Date: 01/19/2016 CLINICAL DATA:  Shortness of breath. Status post right rotator cuff repair today. EXAM: CT ANGIOGRAPHY CHEST WITH CONTRAST TECHNIQUE: Multidetector CT imaging of the chest was performed using the standard protocol during bolus administration of intravenous contrast. Multiplanar CT image reconstructions and MIPs were obtained to evaluate the vascular anatomy. CONTRAST:  75 cc Isovue 370 COMPARISON:  Chest radiographs obtained earlier today. FINDINGS: Cardiovascular: Satisfactory opacification of the pulmonary arteries to the segmental level. No evidence of pulmonary embolism. Normal heart size. No pericardial effusion. Mediastinum/Nodes: No enlarged mediastinal, hilar, or axillary lymph nodes. Thyroid gland, trachea, and esophagus demonstrate no significant findings. Lungs/Pleura: Right lower lobe atelectasis. Right upper lobe atelectasis. No pleural fluid. Upper Abdomen:  Elevated right hemidiaphragm. Musculoskeletal: Right shoulder soft tissue air and fixation anchors compatible with the history of surgery today. Thoracic spine degenerative changes. Review of the MIP images confirms the above findings. IMPRESSION: 1. No pulmonary emboli. 2. Right lower lobe and right upper lobe atelectasis. 3. Elevated right hemidiaphragm. 4. Right shoulder postoperative changes. Electronically Signed   By: Beckie Salts M.D.   On: 01/19/2016 21:17    Disposition: 01-Home or Self Care  Discharge Instructions    Call MD / Call 911    Complete by:  As directed    If you experience chest pain or shortness of breath, CALL 911 and be transported to the hospital emergency room.  If you develope a fever above 101 F, pus (white drainage) or increased drainage or redness at the wound, or calf pain, call your surgeon's office.   Constipation Prevention    Complete by:  As directed    Drink plenty of fluids.  Prune juice may be helpful.  You may use a stool softener, such as Colace (over the counter) 100 mg twice a day.  Use MiraLax (over the counter) for constipation as needed.   Diet - low sodium heart healthy    Complete by:  As directed  Discharge instructions    Complete by:  As directed    Wear sling at all times, including sleep.  You will need to use the sling for a total of 4 weeks following surgery.  Do not try and lift your arm away from your body for any reason.   Keep the dressing dry.  You may remove bandage in 3 days.  Leave the Steri-Strips (white medical tape) in place.  You may place additional Band-Aids over top of the Steri-Strips if you wish.  May shower once dressing is removed in 3 days.  Remove sling carefully only for showers, leaving arm down by your side while in the shower.  Make sure to take some pain medication this evening before you fall asleep, in preparation for the nerve block wearing off in the middle of the night.  If the the pain medication  causes itching, or is too strong, try taking a single tablet at a time, or combining with Benadryl.  You may be most comfortable sleeping in a recliner.  If you do sleep in near bed, placed pillows behind the shoulder that have the operation to support it.   Driving restrictions    Complete by:  As directed    No driving for 4 weeks   Increase activity slowly as tolerated    Complete by:  As directed    Lifting restrictions    Complete by:  As directed    No lifting for 12-16 weeks         Signed: Juanell FairlyKRASINSKI, Lotus Gover ,MD 01/21/2016, 12:50 PM

## 2016-02-10 NOTE — Discharge Summary (Signed)
Physician Discharge Summary  Patient ID: Judy Burnett MRN: [id number] DOB/AGE: [birth year] 50 y.o.  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 Anxiety   . Bronchitis 11/2015  . Central obesity   . CHF (congestive heart failure) (HCC) 2007   ? history  . GERD (gastroesophageal reflux disease)   . Hyperlipidemia   . Hypothyroidism   . Sleep disorder     Surgeries:  on    Consultants (if any):   Discharged Condition: Improved  Hospital Course: Judy Burnett is an 50 y.o. female who was admitted 01/19/2016 with a diagnosis of post-op pain and SOB.  CXR demonstrated a right hemidiaphram after an interscalene block.   Patient was admitted for respiratory monitoring and respiratory therapy after undergoing an uncomplicated right shoulder rotator cuff repair.    She was given perioperative antibiotics:  Anti-infectives    None    .   She benefited maximally from the hospital stay and there were no complications.  Her pain and breathing improved over the course of 2 days and was discharged home without complication.  Recent vital signs:  Vitals:   01/21/16 0746 01/21/16 1436  BP: 127/66 134/68  Pulse: (!) 56 73  Resp: 18 18  Temp: 97.6 F (36.4 C) 98.8 F (37.1 C)    Recent laboratory studies:  Lab Results  Component Value Date   HGB 12.8 01/19/2016   HGB 13.0 01/10/2016   HGB 14.1 10/08/2014   Lab Results  Component Value Date   WBC 17.6 (H) 01/19/2016   PLT 272 01/19/2016   Lab Results  Component Value Date   INR 0.97 01/10/2016   Lab Results  Component Value Date   NA 138 01/19/2016   K 3.4 (L) 01/19/2016   CL 102 01/19/2016   CO2 27 01/19/2016   BUN 7 01/19/2016   CREATININE 0.65 01/19/2016   GLUCOSE 142 (H) 01/19/2016    Discharge Medications:     Medication List    STOP taking these medications   acetaminophen 500 MG tablet Commonly known as:  TYLENOL   aspirin-acetaminophen-caffeine 250-250-65 MG tablet Commonly known as:  EXCEDRIN MIGRAINE   oxyCODONE 5 MG immediate release tablet Commonly known as:  Oxy IR/ROXICODONE     TAKE these medications   albuterol 108 (90 Base) MCG/ACT inhaler Commonly known as:  PROVENTIL HFA;VENTOLIN HFA Inhale into the lungs every 6 (six) hours as needed for wheezing or shortness of breath.   ALPRAZolam 0.25 MG tablet Commonly known as:  XANAX Take 0.25 mg by mouth at bedtime as needed for anxiety or sleep.   citalopram 40 MG tablet Commonly known as:  CELEXA Take 40 mg by mouth at bedtime.   doxycycline 150 MG EC tablet Commonly known as:  DORYX Take 150 mg by mouth 2 (two) times daily. For 10 days.   hydrochlorothiazide 25 MG tablet Commonly known as:  HYDRODIURIL Take 25 mg by mouth every morning.   HYDROcodone-acetaminophen 7.5-325 MG tablet Commonly known as:  NORCO Take 1-2 tablets by mouth every 4 (four) hours as needed for moderate pain.   levothyroxine 50 MCG tablet Commonly known as:  SYNTHROID, LEVOTHROID Take 50 mcg by mouth daily before breakfast.   MULTIVITAMIN PO Take 1 tablet  by mouth at bedtime.   omeprazole 10 MG capsule Commonly known as:  PRILOSEC Take 10 mg by mouth daily as needed (indigestion).   ondansetron 4 MG tablet Commonly known as:  ZOFRAN Take 1 tablet (4 mg total) by mouth every 8 (eight) hours as needed for nausea or vomiting.   rosuvastatin 20 MG tablet Commonly known as:  CRESTOR Take 20 mg by mouth at bedtime.       Diagnostic Studies: Dg Chest 2 View  Result Date: 01/19/2016 CLINICAL DATA:  Worsening shortness of breath after shoulder surgery this morning. EXAM: CHEST  2 VIEW COMPARISON:  Chest radiograph October 08, 2014 FINDINGS: Limited  lateral radiograph as patient was unable to elevate arm. Elevated RIGHT hemidiaphragm new from prior radiograph. Mild bronchitic change without pleural effusion. No pneumothorax. RIGHT lung base strandy densities. Cardiomediastinal silhouette is normal. Suture anchor RIGHT humeral head. Mild degenerative change of the thoracic spine. IMPRESSION: Elevated RIGHT hemidiaphragm, new from prior radiograph with RIGHT lung base atelectasis. Mild bronchitic changes. Electronically Signed   By: Awilda Metroourtnay  Bloomer M.D.   On: 01/19/2016 19:41   Ct Angio Chest Pe W Or Wo Contrast  Result Date: 01/19/2016 CLINICAL DATA:  Shortness of breath. Status post right rotator cuff repair today. EXAM: CT ANGIOGRAPHY CHEST WITH CONTRAST TECHNIQUE: Multidetector CT imaging of the chest was performed using the standard protocol during bolus administration of intravenous contrast. Multiplanar CT image reconstructions and MIPs were obtained to evaluate the vascular anatomy. CONTRAST:  75 cc Isovue 370 COMPARISON:  Chest radiographs obtained earlier today. FINDINGS: Cardiovascular: Satisfactory opacification of the pulmonary arteries to the segmental level. No evidence of pulmonary embolism. Normal heart size. No pericardial effusion. Mediastinum/Nodes: No enlarged mediastinal, hilar, or axillary lymph nodes. Thyroid gland, trachea, and esophagus demonstrate no significant findings. Lungs/Pleura: Right lower lobe atelectasis. Right upper lobe atelectasis. No pleural fluid. Upper Abdomen: Elevated right hemidiaphragm. Musculoskeletal: Right shoulder soft tissue air and fixation anchors compatible with the history of surgery today. Thoracic spine degenerative changes. Review of the MIP images confirms the above findings. IMPRESSION: 1. No pulmonary emboli. 2. Right lower lobe and right upper lobe atelectasis. 3. Elevated right hemidiaphragm. 4. Right shoulder postoperative changes. Electronically Signed   By: Beckie SaltsSteven  Reid M.D.   On:  01/19/2016 21:17    Disposition: 01-Home or Self Care  Discharge Instructions    Call MD / Call 911    Complete by:  As directed    If you experience chest pain or shortness of breath, CALL 911 and be transported to the hospital emergency room.  If you develope a fever above 101 F, pus (white drainage) or increased drainage or redness at the wound, or calf pain, call your surgeon's office.   Constipation Prevention    Complete by:  As directed    Drink plenty of fluids.  Prune juice may be helpful.  You may use a stool softener, such as Colace (over the counter) 100 mg twice a day.  Use MiraLax (over the counter) for constipation as needed.   Diet - low sodium heart healthy    Complete by:  As directed    Discharge instructions    Complete by:  As directed    Wear sling at all times, including sleep.  You will need to use the sling for a total of 4 weeks following surgery.  Do not try and lift your arm away from your body for any reason.   Keep the dressing dry.  You may  remove bandage in 3 days.  Leave the Steri-Strips (white medical tape) in place.  You may place additional Band-Aids over top of the Steri-Strips if you wish.  May shower once dressing is removed in 3 days.  Remove sling carefully only for showers, leaving arm down by your side while in the shower.  Make sure to take some pain medication this evening before you fall asleep, in preparation for the nerve block wearing off in the middle of the night.  If the the pain medication causes itching, or is too strong, try taking a single tablet at a time, or combining with Benadryl.  You may be most comfortable sleeping in a recliner.  If you do sleep in near bed, placed pillows behind the shoulder that have the operation to support it.   Driving restrictions    Complete by:  As directed    No driving for 4 weeks   Increase activity slowly as tolerated    Complete by:  As directed    Lifting restrictions    Complete by:  As  directed    No lifting for 12-16 weeks         Signed: Juanell FairlyKRASINSKI, Refujio Haymer ,MD 02/10/2016, 12:16 PM

## 2016-08-14 ENCOUNTER — Ambulatory Visit: Payer: Self-pay | Admitting: Podiatry

## 2016-08-23 ENCOUNTER — Other Ambulatory Visit: Payer: Self-pay | Admitting: Nurse Practitioner

## 2016-08-23 DIAGNOSIS — Z1231 Encounter for screening mammogram for malignant neoplasm of breast: Secondary | ICD-10-CM

## 2016-10-11 ENCOUNTER — Ambulatory Visit
Admission: RE | Admit: 2016-10-11 | Discharge: 2016-10-11 | Disposition: A | Discharge status: Home / Self Care | Payer: BC Managed Care – PPO | Source: Ambulatory Visit | Attending: Nurse Practitioner | Admitting: Nurse Practitioner

## 2016-10-11 DIAGNOSIS — Z1231 Encounter for screening mammogram for malignant neoplasm of breast: Secondary | ICD-10-CM | POA: Insufficient documentation

## 2017-09-16 ENCOUNTER — Other Ambulatory Visit: Payer: Self-pay | Admitting: Nurse Practitioner

## 2017-09-16 DIAGNOSIS — Z1231 Encounter for screening mammogram for malignant neoplasm of breast: Secondary | ICD-10-CM

## 2017-10-14 ENCOUNTER — Ambulatory Visit
Admission: RE | Admit: 2017-10-14 | Discharge: 2017-10-14 | Disposition: A | Discharge status: Home / Self Care | Payer: BC Managed Care – PPO | Source: Ambulatory Visit | Attending: Nurse Practitioner | Admitting: Nurse Practitioner

## 2017-10-14 DIAGNOSIS — Z1231 Encounter for screening mammogram for malignant neoplasm of breast: Secondary | ICD-10-CM | POA: Diagnosis present

## 2017-11-15 IMAGING — CT CT ANGIO CHEST
2 of 7 series · 18 of 46 positions shown · IV contrast (APPLIED)
Comparison: Chest radiographs obtained earlier today.

CLINICAL DATA: Shortness of breath. Status post right rotator cuff
repair today.

EXAM:
CT ANGIOGRAPHY CHEST WITH CONTRAST
TECHNIQUE: Multidetector CT imaging of the chest was performed using the
standard protocol during bolus administration of intravenous
contrast. Multiplanar CT image reconstructions and MIPs were
obtained to evaluate the vascular anatomy.
CONTRAST:  75 cc Isovue 370

[Series 5: thins · axial · 0.80mm/px · z∈[-474,-218]mm · 16 of 288 slices shown]
[im 16/288  lung]
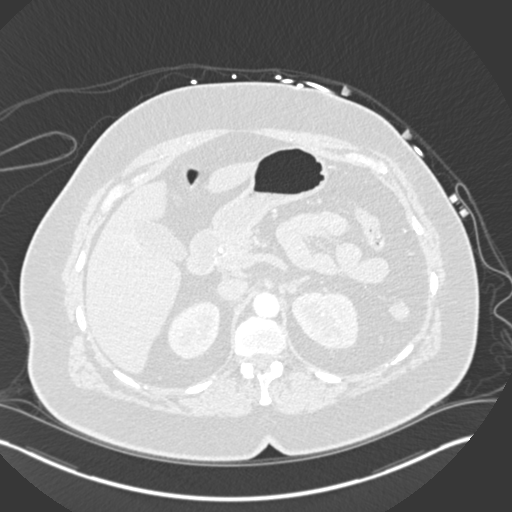
[im 31/288  soft-tissue]
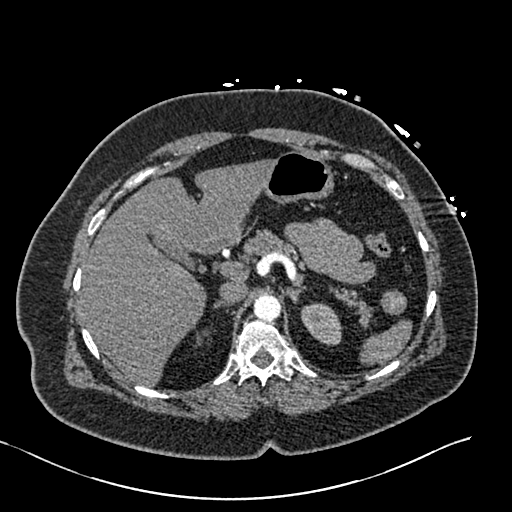
[im 46/288  lung]
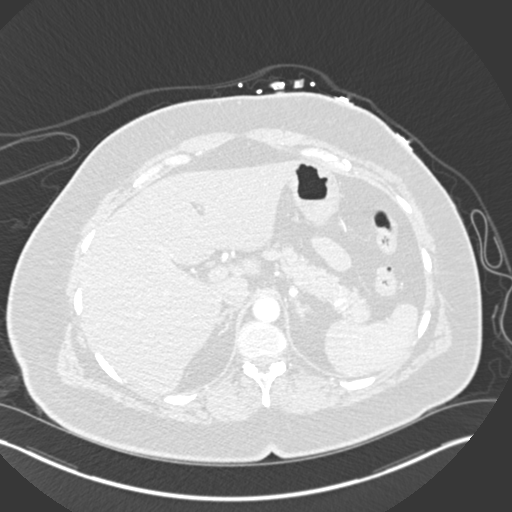
[im 61/288  soft-tissue]
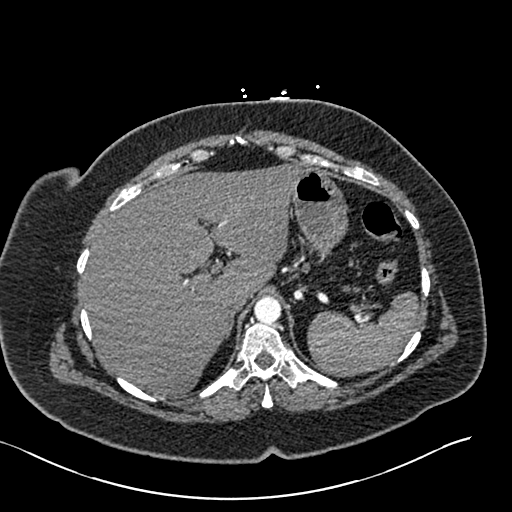
[im 91/288  lung]
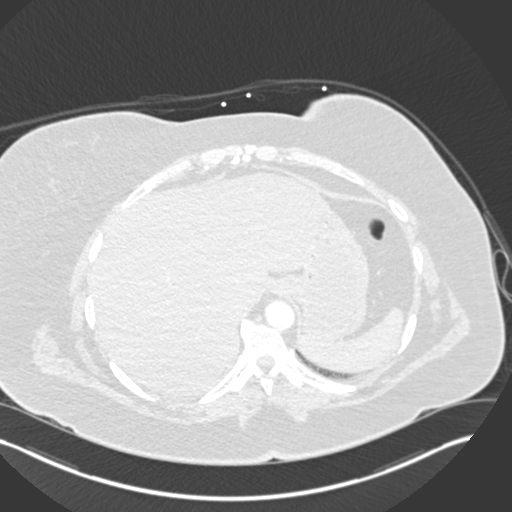
[im 106/288  soft-tissue]
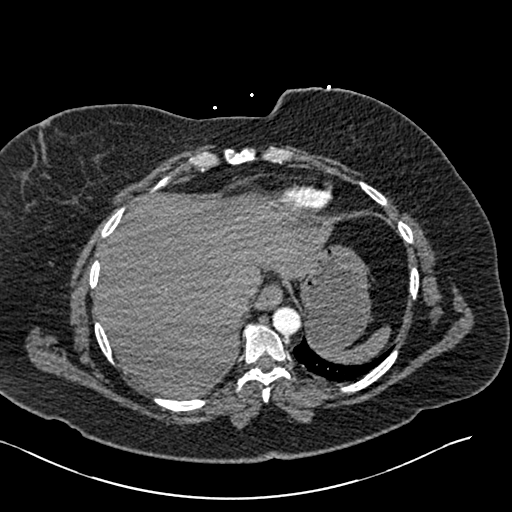
[im 121/288  lung]
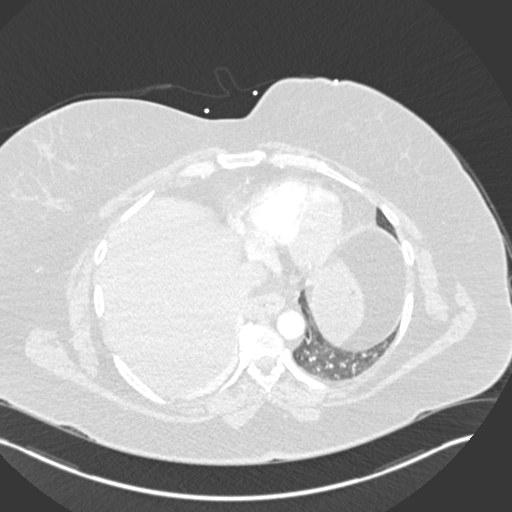
[im 136/288  soft-tissue]
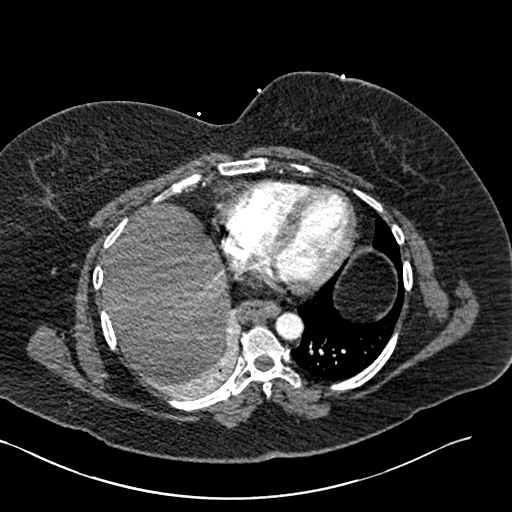
[im 152/288  lung]
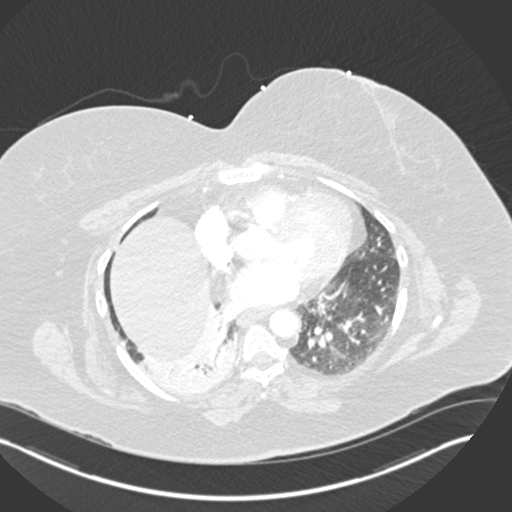
[im 167/288  soft-tissue]
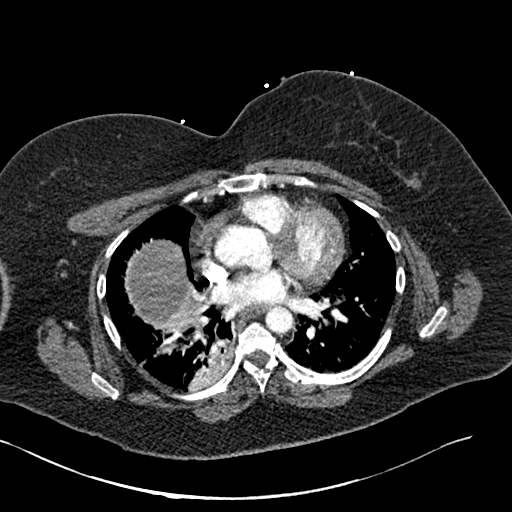
[im 182/288  lung]
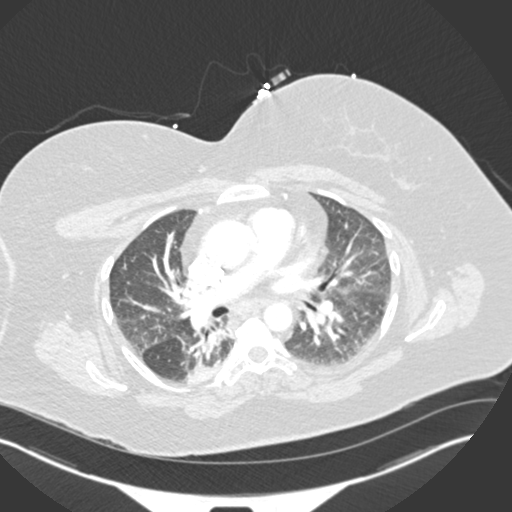
[im 197/288  soft-tissue]
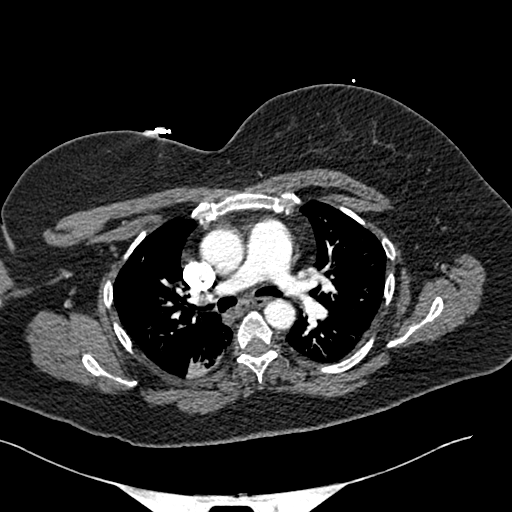
[im 227/288  lung]
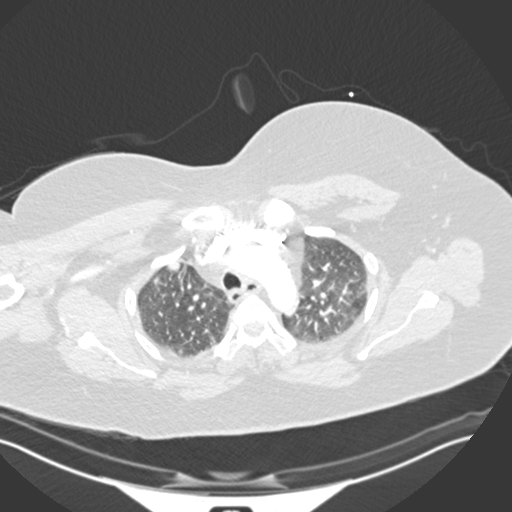
[im 242/288  soft-tissue]
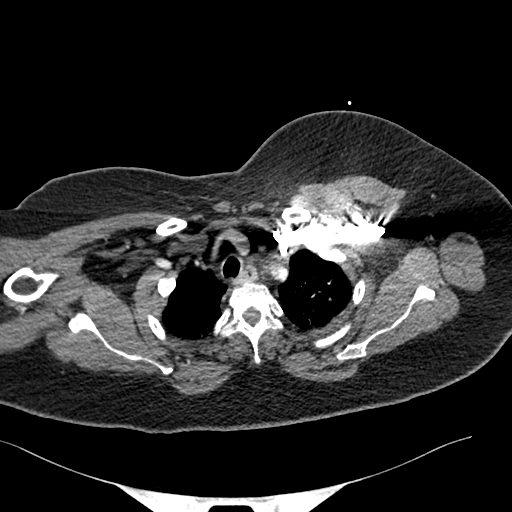
[im 257/288  lung]
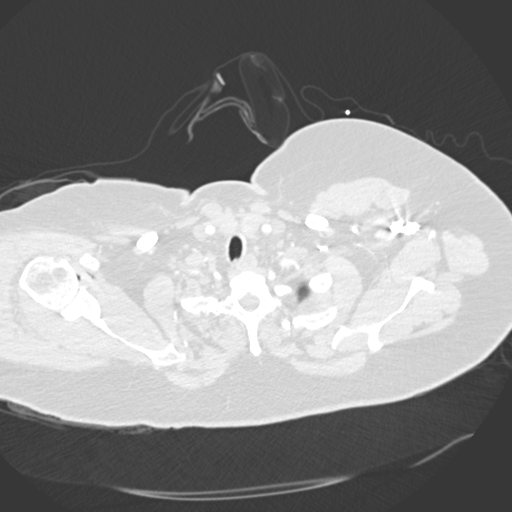
[im 272/288  soft-tissue]
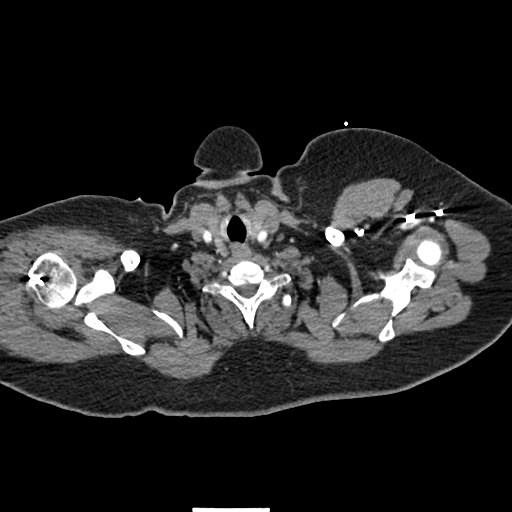

[Series 7: coronal mpr · coronal · 0.56mm/px · 2 of 88 slices shown]
[im 30/88  soft-tissue]
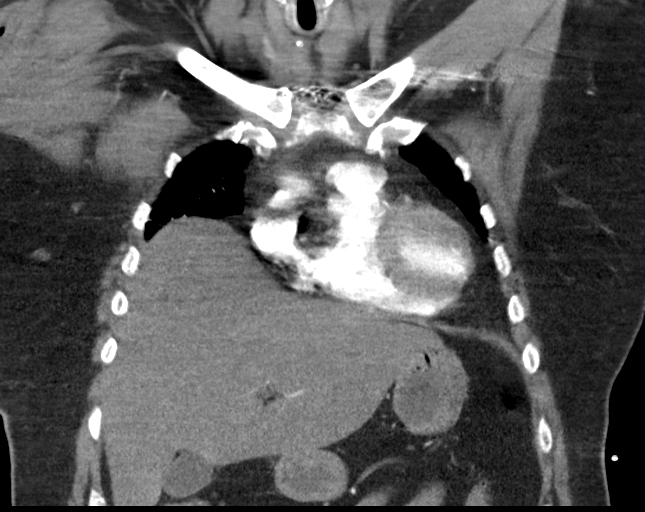
[im 59/88  soft-tissue]
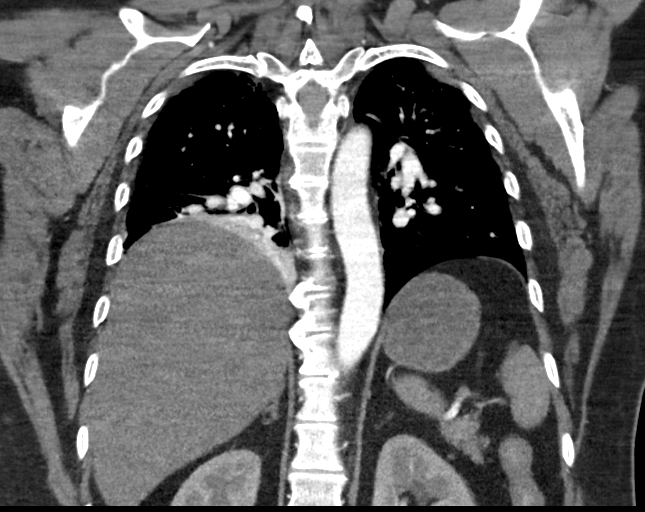

[18 of 46 positions shown; findings below may reference images not displayed]

FINDINGS: Cardiovascular: Satisfactory opacification of the pulmonary arteries
to the segmental level. No evidence of pulmonary embolism. Normal
heart size. No pericardial effusion.

Mediastinum/Nodes: No enlarged mediastinal, hilar, or axillary lymph
nodes. Thyroid gland, trachea, and esophagus demonstrate no
significant findings.

Lungs/Pleura: Right lower lobe atelectasis. Right upper lobe
atelectasis. No pleural fluid.

Upper Abdomen: Elevated right hemidiaphragm.

Musculoskeletal: Right shoulder soft tissue air and fixation anchors
compatible with the history of surgery today. Thoracic spine
degenerative changes.

Review of the MIP images confirms the above findings.
IMPRESSION: 1. No pulmonary emboli.
2. Right lower lobe and right upper lobe atelectasis.
3. Elevated right hemidiaphragm.
4. Right shoulder postoperative changes.

## 2018-01-13 ENCOUNTER — Other Ambulatory Visit: Payer: Self-pay | Admitting: Nurse Practitioner

## 2018-01-13 DIAGNOSIS — R1084 Generalized abdominal pain: Secondary | ICD-10-CM

## 2018-01-20 ENCOUNTER — Ambulatory Visit
Admission: RE | Admit: 2018-01-20 | Discharge: 2018-01-20 | Disposition: A | Discharge status: Home / Self Care | Payer: BC Managed Care – PPO | Source: Ambulatory Visit | Attending: Nurse Practitioner | Admitting: Nurse Practitioner

## 2018-01-20 ENCOUNTER — Other Ambulatory Visit
Admission: RE | Admit: 2018-01-20 | Discharge: 2018-01-20 | Disposition: A | Discharge status: Home / Self Care | Payer: BC Managed Care – PPO | Source: Ambulatory Visit | Attending: Nurse Practitioner | Admitting: Nurse Practitioner

## 2018-01-20 DIAGNOSIS — K760 Fatty (change of) liver, not elsewhere classified: Secondary | ICD-10-CM | POA: Insufficient documentation

## 2018-01-20 DIAGNOSIS — R1084 Generalized abdominal pain: Secondary | ICD-10-CM

## 2018-01-20 LAB — C DIFFICILE QUICK SCREEN W PCR REFLEX
C Diff antigen: POSITIVE — AB
C Diff toxin: NEGATIVE

## 2018-01-20 LAB — GASTROINTESTINAL PANEL BY PCR, STOOL (REPLACES STOOL CULTURE)

## 2018-01-20 LAB — CLOSTRIDIUM DIFFICILE BY PCR, REFLEXED: Toxigenic C. Difficile by PCR: NEGATIVE

## 2018-01-21 ENCOUNTER — Other Ambulatory Visit: Payer: Self-pay | Admitting: Nurse Practitioner

## 2018-01-21 DIAGNOSIS — R110 Nausea: Secondary | ICD-10-CM

## 2018-01-21 DIAGNOSIS — R1084 Generalized abdominal pain: Secondary | ICD-10-CM

## 2018-01-29 ENCOUNTER — Encounter
Admission: RE | Admit: 2018-01-29 | Discharge: 2018-01-29 | Disposition: A | Discharge status: Home / Self Care | Payer: BC Managed Care – PPO | Source: Ambulatory Visit | Attending: Nurse Practitioner | Admitting: Nurse Practitioner

## 2018-01-29 ENCOUNTER — Ambulatory Visit: Payer: BC Managed Care – PPO

## 2018-01-29 DIAGNOSIS — R1084 Generalized abdominal pain: Secondary | ICD-10-CM | POA: Insufficient documentation

## 2018-01-29 DIAGNOSIS — R110 Nausea: Secondary | ICD-10-CM | POA: Diagnosis present

## 2018-01-29 MED ORDER — TECHNETIUM TC 99M MEBROFENIN IV KIT
5.0000 | PACK | Freq: Once | INTRAVENOUS | Status: AC | PRN
  Administered 2018-01-29: 5.419 via INTRAVENOUS

## 2018-02-13 ENCOUNTER — Encounter: Payer: Self-pay | Admitting: *Deleted

## 2018-02-14 ENCOUNTER — Ambulatory Visit: Payer: BC Managed Care – PPO | Admitting: Certified Registered"

## 2018-02-14 ENCOUNTER — Encounter
Admission: RE | Disposition: A | Discharge status: Home / Self Care | Payer: Self-pay | Source: Home / Self Care | Attending: Unknown Physician Specialty

## 2018-02-14 ENCOUNTER — Ambulatory Visit
Admission: RE | Admit: 2018-02-14 | Discharge: 2018-02-14 | Disposition: A | Discharge status: Home / Self Care | Payer: BC Managed Care – PPO | Attending: Unknown Physician Specialty | Admitting: Unknown Physician Specialty

## 2018-02-14 ENCOUNTER — Other Ambulatory Visit: Payer: Self-pay

## 2018-02-14 DIAGNOSIS — I509 Heart failure, unspecified: Secondary | ICD-10-CM | POA: Insufficient documentation

## 2018-02-14 DIAGNOSIS — E785 Hyperlipidemia, unspecified: Secondary | ICD-10-CM | POA: Insufficient documentation

## 2018-02-14 DIAGNOSIS — K2950 Unspecified chronic gastritis without bleeding: Secondary | ICD-10-CM | POA: Insufficient documentation

## 2018-02-14 DIAGNOSIS — E039 Hypothyroidism, unspecified: Secondary | ICD-10-CM | POA: Insufficient documentation

## 2018-02-14 DIAGNOSIS — I110 Hypertensive heart disease with heart failure: Secondary | ICD-10-CM | POA: Insufficient documentation

## 2018-02-14 DIAGNOSIS — K219 Gastro-esophageal reflux disease without esophagitis: Secondary | ICD-10-CM | POA: Diagnosis not present

## 2018-02-14 DIAGNOSIS — F419 Anxiety disorder, unspecified: Secondary | ICD-10-CM | POA: Insufficient documentation

## 2018-02-14 DIAGNOSIS — Z79899 Other long term (current) drug therapy: Secondary | ICD-10-CM | POA: Diagnosis not present

## 2018-02-14 HISTORY — PX: ESOPHAGOGASTRODUODENOSCOPY (EGD) WITH PROPOFOL: SHX5813

## 2018-02-14 HISTORY — DX: Diarrhea, unspecified: R19.7

## 2018-02-14 SURGERY — ESOPHAGOGASTRODUODENOSCOPY (EGD) WITH PROPOFOL
Anesthesia: General

## 2018-02-14 MED ORDER — SODIUM CHLORIDE 0.9 % IV SOLN: INTRAVENOUS | Status: DC

## 2018-02-14 MED ORDER — PROPOFOL 500 MG/50ML IV EMUL
INTRAVENOUS | Status: DC | PRN
  Administered 2018-02-14: 175 ug/kg/min via INTRAVENOUS

## 2018-02-14 MED ORDER — LIDOCAINE HCL (CARDIAC) PF 100 MG/5ML IV SOSY
PREFILLED_SYRINGE | INTRAVENOUS | Status: DC | PRN
  Administered 2018-02-14: 60 mg via INTRAVENOUS

## 2018-02-14 MED ORDER — LIDOCAINE HCL (PF) 1 % IJ SOLN
INTRAMUSCULAR | Status: AC
  Filled 2018-02-14: qty 2

## 2018-02-14 MED ORDER — SODIUM CHLORIDE 0.9 % IV SOLN
INTRAVENOUS | Status: DC | PRN
  Administered 2018-02-14: 11:00:00 via INTRAVENOUS

## 2018-02-14 MED ORDER — PROPOFOL 10 MG/ML IV BOLUS
INTRAVENOUS | Status: DC | PRN
  Administered 2018-02-14: 50 mg via INTRAVENOUS

## 2018-02-14 MED ORDER — SODIUM CHLORIDE 0.9 % IV SOLN
INTRAVENOUS | Status: DC
  Administered 2018-02-14: 11:00:00 via INTRAVENOUS

## 2018-02-14 MED ORDER — PROPOFOL 10 MG/ML IV BOLUS
INTRAVENOUS | Status: AC
  Filled 2018-02-14: qty 20

## 2018-02-14 NOTE — Anesthesia Post-op Follow-up Note (Signed)
Anesthesia QCDR form completed.        

## 2018-02-14 NOTE — Anesthesia Postprocedure Evaluation (Signed)
Anesthesia Post Note  Patient: Judy Burnett  Procedure(s) Performed: ESOPHAGOGASTRODUODENOSCOPY (EGD) WITH PROPOFOL (N/A )  Patient location during evaluation: Endoscopy Anesthesia Type: General Level of consciousness: awake and alert and oriented Pain management: pain level controlled Vital Signs Assessment: post-procedure vital signs reviewed and stable Respiratory status: spontaneous breathing, nonlabored ventilation and respiratory function stable Cardiovascular status: blood pressure returned to baseline and stable Postop Assessment: no signs of nausea or vomiting Anesthetic complications: no     Last Vitals:  Vitals:   02/14/18 1132 02/14/18 1150  BP: 107/71 116/63  Pulse: 69   Resp: 15   Temp:    SpO2: 96%     Last Pain:  Vitals:   02/14/18 1132  TempSrc:   PainSc: 0-No pain                 Cagney Steenson

## 2018-02-14 NOTE — Transfer of Care (Signed)
Immediate Anesthesia Transfer of Care Note  Patient: Judy Burnett  Procedure(s) Performed: ESOPHAGOGASTRODUODENOSCOPY (EGD) WITH PROPOFOL (N/A )  Patient Location: PACU  Anesthesia Type:General  Level of Consciousness: awake, alert  and oriented  Airway & Oxygen Therapy: Patient Spontanous Breathing  Post-op Assessment: Report given to RN and Post -op Vital signs reviewed and stable  Post vital signs: Reviewed and stable  Last Vitals:  Vitals Value Taken Time  BP    Temp    Pulse    Resp    SpO2      Last Pain:  Vitals:   02/14/18 1017  TempSrc: Oral  PainSc:       Patients Stated Pain Goal: 2 (02/14/18 1010)  Complications: No apparent anesthesia complications

## 2018-02-14 NOTE — Anesthesia Preprocedure Evaluation (Signed)
Anesthesia Evaluation  Patient identified by MRN, date of birth, ID band Patient awake    Reviewed: Allergy & Precautions, NPO status , Patient's Chart, lab work & pertinent test results  History of Anesthesia Complications Negative for: history of anesthetic complications  Airway Mallampati: III  TM Distance: >3 FB Neck ROM: Full    Dental no notable dental hx.    Pulmonary neg sleep apnea, neg COPD,    breath sounds clear to auscultation- rhonchi (-) wheezing      Cardiovascular hypertension, Pt. on medications (-) CAD, (-) Past MI, (-) Cardiac Stents and (-) CABG  Rhythm:Regular Rate:Normal - Systolic murmurs and - Diastolic murmurs    Neuro/Psych neg Seizures Anxiety negative neurological ROS     GI/Hepatic Neg liver ROS, GERD  ,  Endo/Other  neg diabetesHypothyroidism   Renal/GU negative Renal ROS     Musculoskeletal negative musculoskeletal ROS (+)   Abdominal (+) + obese,   Peds  Hematology negative hematology ROS (+)   Anesthesia Other Findings Past Medical History: No date: Anxiety 11/2015: Bronchitis No date: Central obesity 2007: CHF (congestive heart failure) (HCC)     Comment:  ? history No date: Diarrhea No date: GERD (gastroesophageal reflux disease) No date: Hyperlipidemia No date: Hypertension No date: Hypothyroidism No date: Sleep disorder No date: Sleep disorder   Reproductive/Obstetrics                             Anesthesia Physical Anesthesia Plan  ASA: II  Anesthesia Plan: General   Post-op Pain Management:    Induction: Intravenous  PONV Risk Score and Plan: 2 and Propofol infusion  Airway Management Planned: Natural Airway  Additional Equipment:   Intra-op Plan:   Post-operative Plan:   Informed Consent: I have reviewed the patients History and Physical, chart, labs and discussed the procedure including the risks, benefits and alternatives  for the proposed anesthesia with the patient or authorized representative who has indicated his/her understanding and acceptance.   Dental advisory given  Plan Discussed with: CRNA and Anesthesiologist  Anesthesia Plan Comments:         Anesthesia Quick Evaluation

## 2018-02-14 NOTE — Op Note (Signed)
[facility] Gastroenterology Patient Name: Judy Burnett Procedure Date: 02/14/2018 11:02 AM MRN: [id number] Account #: [id number] Date of Birth: 26-Nov-1965 Admit Type: Outpatient Age: 52 Room: [facility] ENDO ROOM 1 Gender: Female Note Status: Finalized Procedure:            Upper GI endoscopy Indications:          Epigastric abdominal pain Providers:            Scot Jun, MD Medicines:            Propofol per Anesthesia Complications:        No immediate complications. Procedure:            Pre-Anesthesia Assessment:                       - After reviewing the risks and benefits, the patient                        was deemed in satisfactory condition to undergo the                        procedure.                       After obtaining informed consent, the endoscope was                        passed under direct vision. Throughout the procedure,                        the patient's blood pressure, pulse, and oxygen                        saturations were monitored continuously. The Endoscope                        was introduced through the mouth, and advanced to the                        second part of duodenum. The upper GI endoscopy was                        accomplished without difficulty. The patient tolerated                        the procedure well. Findings:      The examined esophagus was normal. GEJ 36cm.      Patchy mildly erythematous mucosa without bleeding was found in the       gastric antrum. Biopsies were taken with a cold forceps for histology.       Biopsies were taken with a cold forceps for Helicobacter pylori testing.      The gastric body was normal. Biopsies were taken with a cold forceps for       histology.      The examined duodenum was normal. Impression:           - Normal esophagus.                       - Erythematous mucosa in the antrum. Biopsied.                       -  Normal gastric body. Biopsied.    - Normal examined duodenum. Recommendation:       - Await pathology results. Take medicine Scot Junobert T , MD 02/14/2018 11:22:29 AM This report has been signed electronically. Number of Addenda: 0 Note Initiated On: 02/14/2018 11:02 AM      [facility]

## 2018-02-14 NOTE — H&P (Signed)
Primary Care Physician:  Erasmo Downer, NP Primary Gastroenterologist:  Dr. Mechele Collin  Pre-Procedure History & Physical: HPI:  Judy Burnett is a 52 y.o. female is here for an endoscopy.   Past Medical History:  Diagnosis Date  . Anxiety   . Bronchitis 11/2015  . Central obesity   . CHF (congestive heart failure) (HCC) 2007   ? history  . Diarrhea   . GERD (gastroesophageal reflux disease)   . Hyperlipidemia   . Hypertension   . Hypothyroidism   . Sleep disorder   . Sleep disorder     Past Surgical History:  Procedure Laterality Date  . ABDOMINAL HYSTERECTOMY     BIL S&O  . APPENDECTOMY    . BREAST BIOPSY Left 01/2008   neg  . COLONOSCOPY WITH PROPOFOL N/A 12/30/2015   Procedure: COLONOSCOPY WITH PROPOFOL;  Surgeon: Scot Jun, MD;  Location: [facility] ENDOSCOPY;  Service: Endoscopy;  Laterality: N/A;  . DERMOID CYST REMOVAL  3/209  . SHOULDER ARTHROSCOPY WITH OPEN ROTATOR CUFF REPAIR Right 01/19/2016   Procedure: SHOULDER ARTHROSCOPY WITH OPEN ROTATOR CUFF REPAIR;  Surgeon: Juanell Fairly, MD;  Location: [facility] ORS;  Service: Orthopedics;  Laterality: Right;    Prior to Admission medications   Medication Sig Start Date End Date Taking? Authorizing Provider  ALPRAZolam Prudy Feeler) 0.25 MG tablet Take 0.25 mg by mouth at bedtime as needed for anxiety or sleep.    Yes [provider]  citalopram (CELEXA) 40 MG tablet Take 40 mg by mouth at bedtime.   Yes [provider]  hydrochlorothiazide (HYDRODIURIL) 25 MG tablet Take 25 mg by mouth every morning.    Yes [provider]  levothyroxine (SYNTHROID, LEVOTHROID) 50 MCG tablet Take 50 mcg by mouth daily before breakfast.   Yes [provider]  montelukast (SINGULAIR) 10 MG tablet Take 10 mg by mouth at bedtime.   Yes [provider]  Multiple Vitamins-Minerals (MULTIVITAMIN PO) Take 1 tablet by mouth at bedtime.   Yes [provider]  omeprazole (PRILOSEC) 10 MG capsule  Take 10 mg by mouth daily as needed (indigestion).    Yes [provider]  rosuvastatin (CRESTOR) 20 MG tablet Take 20 mg by mouth at bedtime.   Yes [provider]  albuterol (PROVENTIL HFA;VENTOLIN HFA) 108 (90 Base) MCG/ACT inhaler Inhale into the lungs every 6 (six) hours as needed for wheezing or shortness of breath.    [provider]  doxycycline (DORYX) 150 MG EC tablet Take 150 mg by mouth 2 (two) times daily. For 10 days.    [provider]  HYDROcodone-acetaminophen (NORCO) 7.5-325 MG tablet Take 1-2 tablets by mouth every 4 (four) hours as needed for moderate pain. Patient not taking: Reported on 02/14/2018 01/21/16   Juanell Fairly, MD  ondansetron (ZOFRAN) 4 MG tablet Take 1 tablet (4 mg total) by mouth every 8 (eight) hours as needed for nausea or vomiting. Patient not taking: Reported on 02/14/2018 01/19/16   Juanell Fairly, MD    Allergies as of 02/10/2018  . (No Known Allergies)    Family History  Problem Relation Age of Onset  . Breast cancer Neg Hx     Social History   Socioeconomic History  . Marital status: Married    Spouse name: Not on file  . Number of children: Not on file  . Years of education: Not on file  . Highest education level: Not on file  Occupational History  . Not on file  Social  Needs  . Financial resource strain: Not on file  . Food insecurity:    Worry: Not on file    Inability: Not on file  . Transportation needs:    Medical: Not on file    Non-medical: Not on file  Tobacco Use  . Smoking status: Never Smoker  . Smokeless tobacco: Never Used  Substance and Sexual Activity  . Alcohol use: No  . Drug use: No  . Sexual activity: Not on file  Lifestyle  . Physical activity:    Days per week: Not on file    Minutes per session: Not on file  . Stress: Not on file  Relationships  . Social connections:    Talks on phone: Not on file    Gets together: Not on file    Attends religious service:  Not on file    Active member of club or organization: Not on file    Attends meetings of clubs or organizations: Not on file    Relationship status: Not on file  . Intimate partner violence:    Fear of current or ex partner: Not on file    Emotionally abused: Not on file    Physically abused: Not on file    Forced sexual activity: Not on file  Other Topics Concern  . Not on file  Social History Narrative  . Not on file    Review of Systems: See HPI, otherwise negative ROS  Physical Exam: BP 125/71   Pulse 73   Temp 97.9 F (36.6 C) (Oral)   Resp 16   Ht 5\' 4"  (1.626 m)   Wt 86.2 kg   SpO2 99%   BMI 32.61 kg/m  General:   Alert,  pleasant and cooperative in NAD Head:  Normocephalic and atraumatic. Neck:  Supple; no masses or thyromegaly. Lungs:  Clear throughout to auscultation.    Heart:  Regular rate and rhythm. Abdomen:  Soft, nontender and nondistended. Normal bowel sounds, without guarding, and without rebound.   Neurologic:  Alert and  oriented x4;  grossly normal neurologically.  Impression/Plan: Judy GoodellLeslie P Burnett is here for an endoscopy to be performed for epigastric pain.  Risks, benefits, limitations, and alternatives regarding  endoscopy have been reviewed with the patient.  Questions have been answered.  All parties agreeable.   Lynnae PrudeELLIOTT, ROBERT, MD  02/14/2018, 11:02 AM

## 2018-02-18 LAB — SURGICAL PATHOLOGY

## 2018-09-29 ENCOUNTER — Other Ambulatory Visit: Payer: Self-pay | Admitting: Nurse Practitioner

## 2018-09-29 DIAGNOSIS — Z1231 Encounter for screening mammogram for malignant neoplasm of breast: Secondary | ICD-10-CM

## 2018-11-03 ENCOUNTER — Ambulatory Visit
Admission: RE | Admit: 2018-11-03 | Discharge: 2018-11-03 | Disposition: A | Discharge status: Home / Self Care | Payer: BC Managed Care – PPO | Source: Ambulatory Visit | Attending: Nurse Practitioner | Admitting: Nurse Practitioner

## 2018-11-03 DIAGNOSIS — Z1231 Encounter for screening mammogram for malignant neoplasm of breast: Secondary | ICD-10-CM

## 2019-06-01 ENCOUNTER — Emergency Department
Admission: EM | Admit: 2019-06-01 | Discharge: 2019-06-01 | Disposition: A | Discharge status: Home / Self Care | Payer: BC Managed Care – PPO | Attending: Emergency Medicine | Admitting: Emergency Medicine

## 2019-06-01 ENCOUNTER — Encounter: Payer: Self-pay | Admitting: Emergency Medicine

## 2019-06-01 ENCOUNTER — Emergency Department: Payer: BC Managed Care – PPO

## 2019-06-01 ENCOUNTER — Other Ambulatory Visit: Payer: Self-pay

## 2019-06-01 DIAGNOSIS — Z79899 Other long term (current) drug therapy: Secondary | ICD-10-CM | POA: Diagnosis not present

## 2019-06-01 DIAGNOSIS — E039 Hypothyroidism, unspecified: Secondary | ICD-10-CM | POA: Insufficient documentation

## 2019-06-01 DIAGNOSIS — M79605 Pain in left leg: Secondary | ICD-10-CM | POA: Insufficient documentation

## 2019-06-01 DIAGNOSIS — I10 Essential (primary) hypertension: Secondary | ICD-10-CM | POA: Insufficient documentation

## 2019-06-01 MED ORDER — MELOXICAM 15 MG PO TABS: 15.0000 mg | ORAL_TABLET | Freq: Every day | ORAL | 1 refills | Status: AC

## 2019-06-01 NOTE — ED Triage Notes (Signed)
Pt tripped over shoe in store 10 days ago and hit shin on metal bar of shopping cart.  Pt has swelling to left shin.  Has been having pain up and down leg originating at shin site. Only swelling around injury site.  Works [employer]

## 2019-06-01 NOTE — ED Provider Notes (Signed)
Emergency Department Provider Note  ____________________________________________  Time seen: Approximately 5:46 PM  I have reviewed the triage vital signs and the nursing notes.   HISTORY  Chief Complaint Leg Pain   Historian Patient    HPI Judy Burnett is a 54 y.o. female presents to the emergency department with concern for left lower extremity swelling.  Patient states that approximately 10 days ago she was at [facility].  Her car stopped abruptly and she hit her lower leg against the metal frame of the shopping cart.  She states that she felt pain immediately but is noticed a lump along anterior shin that has not resolved.  No abrasions or lacerations were sustained.  She states that last night and this morning, she noticed that left calf seem more swollen than normal.  She denies shortness of breath, chest tightness or chest pain.  No recent travel, prolonged immobilization, recent surgery.  No prior history of DVT or PE.   Past Medical History:  Diagnosis Date  . Anxiety   . Bronchitis 11/2015  . Central obesity   . CHF (congestive heart failure) (HCC) 2007   ? history  . Diarrhea   . GERD (gastroesophageal reflux disease)   . Hyperlipidemia   . Hypertension   . Hypothyroidism   . Sleep disorder   . Sleep disorder      Immunizations up to date:  Yes.     Past Medical History:  Diagnosis Date  . Anxiety   . Bronchitis 11/2015  . Central obesity   . CHF (congestive heart failure) (HCC) 2007   ? history  . Diarrhea   . GERD (gastroesophageal reflux disease)   . Hyperlipidemia   . Hypertension   . Hypothyroidism   . Sleep disorder   . Sleep disorder     Patient Active Problem List   Diagnosis Date Noted  . Shortness of breath 01/19/2016    Past Surgical History:  Procedure Laterality Date  . ABDOMINAL HYSTERECTOMY     BIL S&O  . APPENDECTOMY    . BREAST BIOPSY Left 01/2008   neg  . COLONOSCOPY WITH PROPOFOL N/A 12/30/2015   Procedure: COLONOSCOPY  WITH PROPOFOL;  Surgeon: Scot Jun, MD;  Location: [facility] ENDOSCOPY;  Service: Endoscopy;  Laterality: N/A;  . DERMOID CYST REMOVAL  3/209  . ESOPHAGOGASTRODUODENOSCOPY (EGD) WITH PROPOFOL N/A 02/14/2018   Procedure: ESOPHAGOGASTRODUODENOSCOPY (EGD) WITH PROPOFOL;  Surgeon: Scot Jun, MD;  Location: [facility] ENDOSCOPY;  Service: Endoscopy;  Laterality: N/A;  . SHOULDER ARTHROSCOPY WITH OPEN ROTATOR CUFF REPAIR Right 01/19/2016   Procedure: SHOULDER ARTHROSCOPY WITH OPEN ROTATOR CUFF REPAIR;  Surgeon: Juanell Fairly, MD;  Location: [facility] ORS;  Service: Orthopedics;  Laterality: Right;    Prior to Admission medications   Medication Sig Start Date End Date Taking? Authorizing Provider  albuterol (PROVENTIL HFA;VENTOLIN HFA) 108 (90 Base) MCG/ACT inhaler Inhale into the lungs every 6 (six) hours as needed for wheezing or shortness of breath.    [provider]  ALPRAZolam Prudy Feeler) 0.25 MG tablet Take 0.25 mg by mouth at bedtime as needed for anxiety or sleep.     [provider]  citalopram (CELEXA) 40 MG tablet Take 40 mg by mouth at bedtime.    [provider]  doxycycline (DORYX) 150 MG EC tablet Take 150 mg by mouth 2 (two) times daily. For 10 days.    [provider]  hydrochlorothiazide (HYDRODIURIL) 25 MG tablet Take 25 mg by mouth every morning.  [provider]  levothyroxine (SYNTHROID, LEVOTHROID) 50 MCG tablet Take 50 mcg by mouth daily before breakfast.    [provider]  montelukast (SINGULAIR) 10 MG tablet Take 10 mg by mouth at bedtime.    [provider]  Multiple Vitamins-Minerals (MULTIVITAMIN PO) Take 1 tablet by mouth at bedtime.    [provider]  omeprazole (PRILOSEC) 10 MG capsule Take 10 mg by mouth daily as needed (indigestion).     [provider]  rosuvastatin (CRESTOR) 20 MG tablet Take 20 mg by mouth at bedtime.    [provider]    Allergies Patient has no known  allergies.  Family History  Problem Relation Age of Onset  . Breast cancer Neg Hx     Social History Social History   Tobacco Use  . Smoking status: Never Smoker  . Smokeless tobacco: Never Used  Substance Use Topics  . Alcohol use: No  . Drug use: No     Review of Systems  Constitutional: No fever/chills Eyes:  No discharge ENT: No upper respiratory complaints. Respiratory: no cough. No SOB/ use of accessory muscles to breath Gastrointestinal:   No nausea, no vomiting.  No diarrhea.  No constipation. Musculoskeletal: Patient with left sided lower leg pain.  Skin: Negative for rash, abrasions, lacerations, ecchymosis.    ____________________________________________   PHYSICAL EXAM:  VITAL SIGNS: ED Triage Vitals  Enc Vitals Group     BP 06/01/19 1706 138/70     Pulse Rate 06/01/19 1700 87     Resp 06/01/19 1700 16     Temp 06/01/19 1706 98.5 F (36.9 C)     Temp src --      SpO2 06/01/19 1700 96 %     Weight 06/01/19 1700 185 lb (83.9 kg)     Height 06/01/19 1700 5\' 4"  (1.626 m)     Head Circumference --      Peak Flow --      Pain Score 06/01/19 1700 8     Pain Loc --      Pain Edu? --      Excl. in [place]? --      Constitutional: Alert and oriented. Well appearing and in no acute distress. Eyes: Conjunctivae are normal. PERRL. EOMI. Head: Atraumatic. Cardiovascular: Normal rate, regular rhythm. Normal S1 and S2.  Good peripheral circulation. Respiratory: Normal respiratory effort without tachypnea or retractions. Lungs CTAB. Good air entry to the bases with no decreased or absent breath sounds Gastrointestinal: Bowel sounds x 4 quadrants. Soft and nontender to palpation. No guarding or rigidity. No distention. Musculoskeletal: Full range of motion to all extremities. No obvious deformities noted Neurologic:  Normal for age. No gross focal neurologic deficits are appreciated.  Skin: Patient has 1-2+ pitting edema of left calf. No erythema.  Psychiatric:  Mood and affect are normal for age. Speech and behavior are normal.   ____________________________________________   LABS (all labs ordered are listed, but only abnormal results are displayed)  Labs Reviewed - No data to display ____________________________________________  EKG   ____________________________________________  RADIOLOGY Unk Pinto, personally viewed and evaluated these images (plain radiographs) as part of my medical decision making, as well as reviewing the written report by the radiologist.  DG Tibia/Fibula Left  Result Date: 06/01/2019 CLINICAL DATA:  Injury and swelling EXAM: LEFT TIBIA AND FIBULA - 2 VIEW COMPARISON:  None. FINDINGS: There is no evidence of fracture or other focal bone lesions. Soft tissues are unremarkable. IMPRESSION: Negative.  Electronically Signed   By: Jasmine Pang M.D.   On: 06/01/2019 17:31    ____________________________________________    PROCEDURES  Procedure(s) performed:     Procedures     Medications - No data to display   ____________________________________________   INITIAL IMPRESSION / ASSESSMENT AND PLAN / ED COURSE  Pertinent labs & imaging results that were available during my care of the patient were reviewed by me and considered in my medical decision making (see chart for details).      Assessment and plan:  Left lower leg pain.  54 year old female presents to the emergency department with left lower leg pain after a contusion type injury that occurred several days ago.   Vital signs were reviewed at triage and were reassuring.   On physical exam, patient had 1+ pitting edema at the left ankle with no erythema to suggest infection or DVT.  Resolving hematoma at left anterior shin was visualized.  Differential diagnosis includes DVT, cellulitis, contusion, CHF.Marland Kitchen  X-ray examination of the left tibia/fibula revealed no acute bony abnormality.  No evidence of DVT on venous ultrasound.  Patient  had no erythema to suggest cellulitis.  Patient states that she was diagnosed with CHF 1 time 10 years ago after a viral illness and states that she has not received chronic management for CHF since that time.  She denies shortness of breath, chest pain or chest tightness.  She denies orthopnea or paroxysmal nocturnal dyspnea.  We will treat patient for a contusion type injury at this time.  She was started on meloxicam.  Advised patient to follow-up with primary care if her symptoms persist.  All patient questions were answered.   ____________________________________________  FINAL CLINICAL IMPRESSION(S) / ED DIAGNOSES  Final diagnoses:  None      NEW MEDICATIONS STARTED DURING THIS VISIT:  ED Discharge Orders    None          This chart was dictated using voice recognition software/Dragon. Despite best efforts to proofread, errors can occur which can change the meaning. Any change was purely unintentional.     Gasper Lloyd 06/01/19 2103    Phineas Semen, MD 06/01/19 2107

## 2019-07-06 ENCOUNTER — Other Ambulatory Visit (HOSPITAL_COMMUNITY): Payer: Self-pay | Admitting: Neurology

## 2019-07-06 ENCOUNTER — Other Ambulatory Visit: Payer: Self-pay | Admitting: Neurology

## 2019-07-06 DIAGNOSIS — R4189 Other symptoms and signs involving cognitive functions and awareness: Secondary | ICD-10-CM

## 2019-07-22 ENCOUNTER — Ambulatory Visit (HOSPITAL_COMMUNITY): Payer: BC Managed Care – PPO

## 2019-07-22 ENCOUNTER — Encounter (HOSPITAL_COMMUNITY): Payer: Self-pay

## 2019-11-23 ENCOUNTER — Other Ambulatory Visit: Payer: Self-pay | Admitting: Nurse Practitioner

## 2019-11-23 DIAGNOSIS — Z1231 Encounter for screening mammogram for malignant neoplasm of breast: Secondary | ICD-10-CM

## 2019-12-07 ENCOUNTER — Ambulatory Visit
Admission: RE | Admit: 2019-12-07 | Discharge: 2019-12-07 | Disposition: A | Discharge status: Home / Self Care | Payer: BC Managed Care – PPO | Source: Ambulatory Visit | Attending: Nurse Practitioner | Admitting: Nurse Practitioner

## 2019-12-07 ENCOUNTER — Other Ambulatory Visit: Payer: Self-pay

## 2019-12-07 DIAGNOSIS — Z1231 Encounter for screening mammogram for malignant neoplasm of breast: Secondary | ICD-10-CM | POA: Diagnosis not present

## 2020-11-10 ENCOUNTER — Other Ambulatory Visit: Payer: Self-pay | Admitting: Nurse Practitioner

## 2020-11-10 DIAGNOSIS — Z1231 Encounter for screening mammogram for malignant neoplasm of breast: Secondary | ICD-10-CM

## 2020-12-07 ENCOUNTER — Ambulatory Visit
Admission: RE | Admit: 2020-12-07 | Discharge: 2020-12-07 | Disposition: A | Discharge status: Home / Self Care | Payer: BC Managed Care – PPO | Source: Ambulatory Visit | Attending: Nurse Practitioner | Admitting: Nurse Practitioner

## 2020-12-07 DIAGNOSIS — Z1231 Encounter for screening mammogram for malignant neoplasm of breast: Secondary | ICD-10-CM | POA: Insufficient documentation

## 2020-12-30 ENCOUNTER — Other Ambulatory Visit: Payer: Self-pay

## 2020-12-30 ENCOUNTER — Other Ambulatory Visit: Payer: Self-pay | Admitting: Orthopedic Surgery

## 2020-12-30 ENCOUNTER — Other Ambulatory Visit
Admission: RE | Admit: 2020-12-30 | Discharge: 2020-12-30 | Disposition: A | Discharge status: Home / Self Care | Payer: BC Managed Care – PPO | Source: Ambulatory Visit | Attending: Orthopedic Surgery | Admitting: Orthopedic Surgery

## 2020-12-30 VITALS — Ht 64.0 in | Wt 190.0 lb

## 2020-12-30 DIAGNOSIS — Z01812 Encounter for preprocedural laboratory examination: Secondary | ICD-10-CM

## 2020-12-30 DIAGNOSIS — I10 Essential (primary) hypertension: Secondary | ICD-10-CM

## 2020-12-30 HISTORY — DX: Depression, unspecified: F32.A

## 2020-12-30 HISTORY — DX: Other complications of anesthesia, initial encounter: T88.59XA

## 2020-12-30 HISTORY — DX: Prediabetes: R73.03

## 2020-12-30 HISTORY — DX: Dyspnea, unspecified: R06.00

## 2020-12-30 NOTE — Patient Instructions (Addendum)
Your procedure is scheduled on: Tuesday January 17, 2021. Report to Day Surgery inside [facility] 2nd floor. To find out your arrival time please call [phone] between 1PM - 3PM on Monday January 16, 2021.  Remember: Instructions that are not followed completely may result in serious medical risk,  up to and including death, or upon the discretion of your surgeon and anesthesiologist your  surgery may need to be rescheduled.     _X__ 1. Do not eat food after midnight the night before your procedure.                 No chewing gum or hard candies.   __X__2.  On the morning of surgery brush your teeth with toothpaste and water, you                may rinse your mouth with mouthwash if you wish.  Do not swallow any toothpaste or mouthwash.     _X__ 3.  No Alcohol for 24 hours before or after surgery.   _X__ 4.  Do Not Smoke or use e-cigarettes For 24 Hours Prior to Your Surgery.                 Do not use any chewable tobacco products for at least 6 hours prior to                 Surgery.  _X__  5.  Do not use any recreational drugs (marijuana, cocaine, heroin, ecstasy, MDMA or other)                For at least one week prior to your surgery.  Combination of these drugs with anesthesia                May have life threatening results.  __X__6.  Notify your doctor if there is any change in your medical condition      (cold, fever, infections).     Do not wear jewelry, make-up, hairpins, clips or nail polish. Do not wear lotions, powders, or perfumes or  deodorant. Do not shave 48 hours prior to surgery.  Do not bring valuables to the hospital.    [facility] is not responsible for any belongings or valuables.  Contacts, dentures or bridgework may not be worn into surgery. Leave your suitcase in the car. After surgery it may be brought to your room. For patients admitted to the hospital, discharge time is determined by your treatment  team.   Patients discharged the day of surgery will not be allowed to drive home.   Make arrangements for someone to be with you for the first 24 hours of your Same Day Discharge.   __X__ Take these medicines the morning of surgery with A SIP OF WATER:    1. escitalopram (LEXAPRO) 20 MG  2. levothyroxine (SYNTHROID) 75 MCG  3. omeprazole (PRILOSEC) 20 MG   4. rosuvastatin (CRESTOR) 20 MG  5.  6.  ____ Fleet Enema (as directed)   __X__ Use CHG Soap (or wipes) as directed  ____ Use Benzoyl Peroxide Gel as instructed  __X__ Use inhalers on the day of surgery  albuterol (PROVENTIL HFA;VENTOLIN HFA) 108 (90 Base) MCG/ACT inhaler  ____ Stop metformin 2 days prior to surgery    ____ Take 1/2 of usual insulin dose the night before surgery. No insulin the morning          of surgery.   ____ Call your PCP, cardiologist, or Pulmonologist  if taking Coumadin/Plavix/aspirin and ask when to stop before your surgery.   __X__ One Week prior to surgery- Stop Anti-inflammatories such as Ibuprofen, Aleve, Advil, Motrin, meloxicam (MOBIC), diclofenac, etodolac, ketorolac, Toradol, Daypro, piroxicam, Goody's or BC powders. OK TO USE TYLENOL IF NEEDED   __X__ Stop supplements until after surgery.    ____ Bring C-Pap to the hospital.    If you have any questions regarding your pre-procedure instructions,  Please call Pre-admit Testing at [phone].

## 2021-01-04 ENCOUNTER — Other Ambulatory Visit
Admission: RE | Admit: 2021-01-04 | Discharge: 2021-01-04 | Disposition: A | Discharge status: Home / Self Care | Payer: BC Managed Care – PPO | Source: Ambulatory Visit | Attending: Orthopedic Surgery | Admitting: Orthopedic Surgery

## 2021-01-04 ENCOUNTER — Other Ambulatory Visit: Payer: Self-pay

## 2021-01-04 DIAGNOSIS — I10 Essential (primary) hypertension: Secondary | ICD-10-CM | POA: Insufficient documentation

## 2021-01-04 DIAGNOSIS — Z01812 Encounter for preprocedural laboratory examination: Secondary | ICD-10-CM

## 2021-01-04 DIAGNOSIS — Z01818 Encounter for other preprocedural examination: Secondary | ICD-10-CM | POA: Diagnosis present

## 2021-01-04 LAB — CBC
HCT: 39.5 % (ref 36.0–46.0)
Hemoglobin: 14.2 g/dL (ref 12.0–15.0)
MCH: 29.8 pg (ref 26.0–34.0)
MCHC: 35.9 g/dL (ref 30.0–36.0)
MCV: 82.8 fL (ref 80.0–100.0)
Platelets: 279 10*3/uL (ref 150–400)
RBC: 4.77 MIL/uL (ref 3.87–5.11)
RDW: 12.6 % (ref 11.5–15.5)
WBC: 10.9 10*3/uL — ABNORMAL HIGH (ref 4.0–10.5)
nRBC: 0 % (ref 0.0–0.2)

## 2021-01-04 LAB — BASIC METABOLIC PANEL
Anion gap: 8 (ref 5–15)
BUN: 8 mg/dL (ref 6–20)
CO2: 29 mmol/L (ref 22–32)
Calcium: 10 mg/dL (ref 8.9–10.3)
Chloride: 102 mmol/L (ref 98–111)
Creatinine, Ser: 0.73 mg/dL (ref 0.44–1.00)
GFR, Estimated: >60 mL/min (ref >60)
Glucose, Bld: 224 mg/dL — ABNORMAL HIGH (ref 70–99)
Potassium: 3.8 mmol/L (ref 3.5–5.1)
Sodium: 139 mmol/L (ref 135–145)

## 2021-01-17 ENCOUNTER — Encounter
Admission: RE | Disposition: A | Discharge status: Home / Self Care | Payer: Self-pay | Source: Home / Self Care | Attending: Orthopedic Surgery

## 2021-01-17 ENCOUNTER — Other Ambulatory Visit: Payer: Self-pay

## 2021-01-17 ENCOUNTER — Ambulatory Visit
Admission: RE | Admit: 2021-01-17 | Discharge: 2021-01-17 | Disposition: A | Discharge status: Home / Self Care | Payer: BC Managed Care – PPO | Attending: Orthopedic Surgery | Admitting: Orthopedic Surgery

## 2021-01-17 ENCOUNTER — Ambulatory Visit: Payer: BC Managed Care – PPO | Admitting: Certified Registered"

## 2021-01-17 ENCOUNTER — Ambulatory Visit: Payer: BC Managed Care – PPO

## 2021-01-17 ENCOUNTER — Ambulatory Visit: Payer: BC Managed Care – PPO | Admitting: Urgent Care

## 2021-01-17 ENCOUNTER — Encounter: Payer: Self-pay | Admitting: Orthopedic Surgery

## 2021-01-17 DIAGNOSIS — Z419 Encounter for procedure for purposes other than remedying health state, unspecified: Secondary | ICD-10-CM

## 2021-01-17 DIAGNOSIS — E039 Hypothyroidism, unspecified: Secondary | ICD-10-CM | POA: Diagnosis not present

## 2021-01-17 DIAGNOSIS — I509 Heart failure, unspecified: Secondary | ICD-10-CM | POA: Insufficient documentation

## 2021-01-17 DIAGNOSIS — M75112 Incomplete rotator cuff tear or rupture of left shoulder, not specified as traumatic: Secondary | ICD-10-CM | POA: Insufficient documentation

## 2021-01-17 DIAGNOSIS — K219 Gastro-esophageal reflux disease without esophagitis: Secondary | ICD-10-CM | POA: Insufficient documentation

## 2021-01-17 DIAGNOSIS — I110 Hypertensive heart disease with heart failure: Secondary | ICD-10-CM | POA: Insufficient documentation

## 2021-01-17 DIAGNOSIS — E785 Hyperlipidemia, unspecified: Secondary | ICD-10-CM | POA: Diagnosis not present

## 2021-01-17 DIAGNOSIS — M25812 Other specified joint disorders, left shoulder: Secondary | ICD-10-CM | POA: Insufficient documentation

## 2021-01-17 DIAGNOSIS — M19012 Primary osteoarthritis, left shoulder: Secondary | ICD-10-CM | POA: Diagnosis not present

## 2021-01-17 HISTORY — PX: SHOULDER ARTHROSCOPY WITH OPEN ROTATOR CUFF REPAIR AND DISTAL CLAVICLE ACROMINECTOMY: SHX5683

## 2021-01-17 SURGERY — SHOULDER ARTHROSCOPY WITH OPEN ROTATOR CUFF REPAIR AND DISTAL CLAVICLE ACROMINECTOMY
Anesthesia: General | Laterality: Left

## 2021-01-17 MED ORDER — ACETAMINOPHEN 500 MG PO TABS
ORAL_TABLET | ORAL | Status: AC
  Administered 2021-01-17: 1000 mg via ORAL
  Filled 2021-01-17: qty 2

## 2021-01-17 MED ORDER — ROCURONIUM BROMIDE 100 MG/10ML IV SOLN
INTRAVENOUS | Status: DC | PRN
  Administered 2021-01-17: 20 mg via INTRAVENOUS
  Administered 2021-01-17: 40 mg via INTRAVENOUS

## 2021-01-17 MED ORDER — ONDANSETRON HCL 4 MG/2ML IJ SOLN
INTRAMUSCULAR | Status: DC | PRN
  Administered 2021-01-17: 4 mg via INTRAVENOUS

## 2021-01-17 MED ORDER — LIDOCAINE HCL (CARDIAC) PF 100 MG/5ML IV SOSY
PREFILLED_SYRINGE | INTRAVENOUS | Status: DC | PRN
  Administered 2021-01-17: 80 mg via INTRAVENOUS

## 2021-01-17 MED ORDER — ROPIVACAINE HCL 5 MG/ML IJ SOLN
INTRAMUSCULAR | Status: AC
  Filled 2021-01-17: qty 30

## 2021-01-17 MED ORDER — SUGAMMADEX SODIUM 200 MG/2ML IV SOLN
INTRAVENOUS | Status: DC | PRN
  Administered 2021-01-17: 200 mg via INTRAVENOUS

## 2021-01-17 MED ORDER — MIDAZOLAM HCL 2 MG/2ML IJ SOLN
INTRAMUSCULAR | Status: AC
  Filled 2021-01-17: qty 2

## 2021-01-17 MED ORDER — OXYCODONE HCL 5 MG PO TABS: 5.0000 mg | ORAL_TABLET | ORAL | 0 refills | Status: AC | PRN

## 2021-01-17 MED ORDER — GLYCOPYRROLATE 0.2 MG/ML IJ SOLN
INTRAMUSCULAR | Status: DC | PRN
  Administered 2021-01-17: .2 mg via INTRAVENOUS

## 2021-01-17 MED ORDER — FENTANYL CITRATE (PF) 100 MCG/2ML IJ SOLN
INTRAMUSCULAR | Status: DC | PRN
  Administered 2021-01-17: 100 ug via INTRAVENOUS

## 2021-01-17 MED ORDER — PROPOFOL 10 MG/ML IV BOLUS
INTRAVENOUS | Status: DC | PRN
  Administered 2021-01-17: 200 mg via INTRAVENOUS

## 2021-01-17 MED ORDER — PHENYLEPHRINE HCL-NACL 20-0.9 MG/250ML-% IV SOLN
INTRAVENOUS | Status: DC | PRN
  Administered 2021-01-17: 50 ug/min via INTRAVENOUS

## 2021-01-17 MED ORDER — HYDROMORPHONE HCL 1 MG/ML IJ SOLN: 0.2500 mg | INTRAMUSCULAR | Status: DC | PRN

## 2021-01-17 MED ORDER — FENTANYL CITRATE (PF) 100 MCG/2ML IJ SOLN
INTRAMUSCULAR | Status: AC
  Filled 2021-01-17: qty 2

## 2021-01-17 MED ORDER — CHLORHEXIDINE GLUCONATE 0.12 % MT SOLN: 15.0000 mL | Freq: Once | OROMUCOSAL | Status: AC

## 2021-01-17 MED ORDER — CHLORHEXIDINE GLUCONATE CLOTH 2 % EX PADS: 6.0000 | MEDICATED_PAD | Freq: Once | CUTANEOUS | Status: DC

## 2021-01-17 MED ORDER — ROCURONIUM BROMIDE 10 MG/ML (PF) SYRINGE
PREFILLED_SYRINGE | INTRAVENOUS | Status: AC
  Filled 2021-01-17: qty 10

## 2021-01-17 MED ORDER — HYDROCODONE-ACETAMINOPHEN 7.5-325 MG PO TABS: 1.0000 | ORAL_TABLET | Freq: Once | ORAL | Status: DC | PRN

## 2021-01-17 MED ORDER — LACTATED RINGERS IR SOLN: Status: DC | PRN

## 2021-01-17 MED ORDER — ACETAMINOPHEN 500 MG PO TABS: 1000.0000 mg | ORAL_TABLET | ORAL | Status: AC

## 2021-01-17 MED ORDER — ONDANSETRON HCL 4 MG PO TABS: 4.0000 mg | ORAL_TABLET | Freq: Three times a day (TID) | ORAL | 0 refills | Status: AC | PRN

## 2021-01-17 MED ORDER — MIDAZOLAM HCL 2 MG/2ML IJ SOLN: 1.0000 mg | Freq: Once | INTRAMUSCULAR | Status: AC

## 2021-01-17 MED ORDER — DEXAMETHASONE SODIUM PHOSPHATE 10 MG/ML IJ SOLN
INTRAMUSCULAR | Status: AC
  Filled 2021-01-17: qty 1

## 2021-01-17 MED ORDER — EPINEPHRINE PF 1 MG/ML IJ SOLN
INTRAMUSCULAR | Status: AC
  Filled 2021-01-17: qty 4

## 2021-01-17 MED ORDER — SODIUM CHLORIDE 0.9 % IR SOLN: Status: DC | PRN

## 2021-01-17 MED ORDER — MIDAZOLAM HCL 2 MG/2ML IJ SOLN
INTRAMUSCULAR | Status: DC | PRN
  Administered 2021-01-17: 1 mg via INTRAVENOUS

## 2021-01-17 MED ORDER — ROPIVACAINE HCL 5 MG/ML IJ SOLN
INTRAMUSCULAR | Status: DC | PRN
  Administered 2021-01-17: 15 mL via PERINEURAL

## 2021-01-17 MED ORDER — FENTANYL CITRATE (PF) 100 MCG/2ML IJ SOLN
50.0000 ug | Freq: Once | INTRAMUSCULAR | Status: AC
  Administered 2021-01-17: 50 ug via INTRAVENOUS

## 2021-01-17 MED ORDER — LACTATED RINGERS IR SOLN
Status: DC | PRN
  Administered 2021-01-17 (×4): 3001 mL

## 2021-01-17 MED ORDER — CHLORHEXIDINE GLUCONATE 0.12 % MT SOLN
OROMUCOSAL | Status: AC
  Administered 2021-01-17: 15 mL via OROMUCOSAL
  Filled 2021-01-17: qty 15

## 2021-01-17 MED ORDER — PROPOFOL 10 MG/ML IV BOLUS
INTRAVENOUS | Status: AC
  Filled 2021-01-17: qty 20

## 2021-01-17 MED ORDER — MIDAZOLAM HCL 2 MG/2ML IJ SOLN
INTRAMUSCULAR | Status: AC
  Administered 2021-01-17: 1 mg via INTRAVENOUS
  Filled 2021-01-17: qty 2

## 2021-01-17 MED ORDER — DEXAMETHASONE SODIUM PHOSPHATE 10 MG/ML IJ SOLN
INTRAMUSCULAR | Status: DC | PRN
Start: 2021-01-17 — End: 2021-01-17
  Administered 2021-01-17: 10 mg via INTRAVENOUS

## 2021-01-17 MED ORDER — LIDOCAINE HCL (PF) 1 % IJ SOLN
INTRAMUSCULAR | Status: AC
  Filled 2021-01-17: qty 5

## 2021-01-17 MED ORDER — ONDANSETRON HCL 4 MG/2ML IJ SOLN
INTRAMUSCULAR | Status: AC
  Filled 2021-01-17: qty 2

## 2021-01-17 MED ORDER — BUPIVACAINE LIPOSOME 1.3 % IJ SUSP
INTRAMUSCULAR | Status: AC
  Filled 2021-01-17: qty 20

## 2021-01-17 MED ORDER — BUPIVACAINE LIPOSOME 1.3 % IJ SUSP
INTRAMUSCULAR | Status: DC | PRN
  Administered 2021-01-17: 15 mL via PERINEURAL

## 2021-01-17 MED ORDER — ORAL CARE MOUTH RINSE: 15.0000 mL | Freq: Once | OROMUCOSAL | Status: AC

## 2021-01-17 MED ORDER — LIDOCAINE HCL (PF) 2 % IJ SOLN
INTRAMUSCULAR | Status: AC
  Filled 2021-01-17: qty 5

## 2021-01-17 MED ORDER — FENTANYL CITRATE PF 50 MCG/ML IJ SOSY
PREFILLED_SYRINGE | INTRAMUSCULAR | Status: AC
  Administered 2021-01-17: 50 ug
  Filled 2021-01-17: qty 1

## 2021-01-17 MED ORDER — LACTATED RINGERS IR SOLN
Status: DC | PRN
  Administered 2021-01-17: 3000 mL

## 2021-01-17 MED ORDER — LACTATED RINGERS IV SOLN: INTRAVENOUS | Status: DC

## 2021-01-17 MED ORDER — CEFAZOLIN SODIUM-DEXTROSE 2-4 GM/100ML-% IV SOLN
2.0000 g | INTRAVENOUS | Status: AC
  Administered 2021-01-17: 2 g via INTRAVENOUS

## 2021-01-17 SURGICAL SUPPLY — 76 items
ADAPTER IRRIG TUBE 2 SPIKE SOL (ADAPTER) ×6 IMPLANT
ADPR TBG 2 SPK PMP STRL ASCP (ADAPTER) ×2
ANCH SUT 5.5 KNTLS PEEK (Orthopedic Implant) ×1 IMPLANT
ANCH SUT Q-FX 2.8 (Anchor) ×2 IMPLANT
ANCHOR ALL-SUT Q-FIX 2.8 (Anchor) ×6 IMPLANT
ANCHOR SUT 5.5 MULTIFIX (Orthopedic Implant) ×2 IMPLANT
ANCHOR SUT 5.5MM MULTIFIX (Orthopedic Implant) ×1 IMPLANT
BLADE SHAVER 4.5X7 STR FR (MISCELLANEOUS) ×6 IMPLANT
BUR BR 5.5 WIDE MOUTH (BURR) IMPLANT
CANISTER SUCT LVC 12 LTR MEDI- (MISCELLANEOUS) IMPLANT
CANNULA 5.75X7 CRYSTAL CLEAR (CANNULA) ×3 IMPLANT
CANNULA PARTIAL THREAD 2X7 (CANNULA) IMPLANT
CANNULA TWIST IN 8.25X9CM (CANNULA) IMPLANT
CLOSURE WOUND 1/2 X4 (GAUZE/BANDAGES/DRESSINGS) ×1
CONNECTOR PERFECT PASSER (CONNECTOR) ×3 IMPLANT
COOLER POLAR GLACIER W/PUMP (MISCELLANEOUS) ×3 IMPLANT
DEVICE SUCT BLK HOLE OR FLOOR (MISCELLANEOUS) ×6 IMPLANT
DRAPE 3/4 80X56 (DRAPES) ×3 IMPLANT
DRAPE IMP U-DRAPE 54X76 (DRAPES) ×6 IMPLANT
DRAPE INCISE IOBAN 66X45 STRL (DRAPES) ×3 IMPLANT
DRAPE U-SHAPE 47X51 STRL (DRAPES) ×3 IMPLANT
DURAPREP 26ML APPLICATOR (WOUND CARE) ×9 IMPLANT
ELECT REM PT RETURN 9FT ADLT (ELECTROSURGICAL) ×3
ELECTRODE REM PT RTRN 9FT ADLT (ELECTROSURGICAL) ×1 IMPLANT
GAUZE SPONGE 4X4 12PLY STRL (GAUZE/BANDAGES/DRESSINGS) ×3 IMPLANT
GAUZE XEROFORM 1X8 LF (GAUZE/BANDAGES/DRESSINGS) ×3 IMPLANT
GLOVE SURG ORTHO LTX SZ9 (GLOVE) ×9 IMPLANT
GLOVE SURG UNDER POLY LF SZ9 (GLOVE) ×3 IMPLANT
GOWN STRL REUS TWL 2XL XL LVL4 (GOWN DISPOSABLE) ×3 IMPLANT
GOWN STRL REUS W/ TWL LRG LVL3 (GOWN DISPOSABLE) ×1 IMPLANT
GOWN STRL REUS W/TWL LRG LVL3 (GOWN DISPOSABLE) ×3
IV LACTATED RINGER IRRG 3000ML (IV SOLUTION) ×18
IV LR IRRIG 3000ML ARTHROMATIC (IV SOLUTION) ×6 IMPLANT
KIT STABILIZATION SHOULDER (MISCELLANEOUS) ×3 IMPLANT
KIT SUTURE 2.8 Q-FIX DISP (MISCELLANEOUS) ×3 IMPLANT
KIT SUTURETAK 3.0 INSERT PERC (KITS) IMPLANT
KIT TURNOVER KIT A (KITS) ×3 IMPLANT
MANIFOLD NEPTUNE II (INSTRUMENTS) ×6 IMPLANT
MASK FACE SPIDER DISP (MASK) ×3 IMPLANT
MAT ABSORB  FLUID 56X50 GRAY (MISCELLANEOUS) ×4
MAT ABSORB FLUID 56X50 GRAY (MISCELLANEOUS) ×2 IMPLANT
NDL SAFETY ECLIPSE 18X1.5 (NEEDLE) ×1 IMPLANT
NEEDLE HYPO 18GX1.5 SHARP (NEEDLE) ×3
NEEDLE HYPO 22GX1.5 SAFETY (NEEDLE) ×3 IMPLANT
NS IRRIG 500ML POUR BTL (IV SOLUTION) ×3 IMPLANT
PACK ARTHROSCOPY SHOULDER (MISCELLANEOUS) ×3 IMPLANT
PAD ABD DERMACEA PRESS 5X9 (GAUZE/BANDAGES/DRESSINGS) ×6 IMPLANT
PAD ARMBOARD 7.5X6 YLW CONV (MISCELLANEOUS) ×6 IMPLANT
PAD WRAPON POLAR SHDR XLG (MISCELLANEOUS) ×1 IMPLANT
PASSER SUT FIRSTPASS SELF (INSTRUMENTS) ×3 IMPLANT
SHAVER BLADE BONE CUTTER  5.5 (BLADE) ×4
SHAVER BLADE BONE CUTTER 5.5 (BLADE) ×2 IMPLANT
SPONGE T-LAP 18X18 ~~LOC~~+RFID (SPONGE) ×3 IMPLANT
STRIP CLOSURE SKIN 1/2X4 (GAUZE/BANDAGES/DRESSINGS) ×2 IMPLANT
SUT ETHILON 4-0 (SUTURE) ×3
SUT ETHILON 4-0 FS2 18XMFL BLK (SUTURE) ×1
SUT LASSO 90 DEG SD STR (SUTURE) IMPLANT
SUT MNCRL 4-0 (SUTURE) ×3
SUT MNCRL 4-0 27XMFL (SUTURE) ×1
SUT PDS AB 0 CT1 27 (SUTURE) ×9 IMPLANT
SUT PERFECTPASSER WHITE CART (SUTURE) ×6 IMPLANT
SUT SMART STITCH CARTRIDGE (SUTURE) ×3 IMPLANT
SUT ULTRABRAID 2 COBRAID 38 (SUTURE) IMPLANT
SUT VIC AB 0 CT1 36 (SUTURE) ×9 IMPLANT
SUT VIC AB 2-0 CT2 27 (SUTURE) ×3 IMPLANT
SUTURE ETHLN 4-0 FS2 18XMF BLK (SUTURE) ×1 IMPLANT
SUTURE MNCRL 4-0 27XMF (SUTURE) ×1 IMPLANT
SYR 10ML LL (SYRINGE) ×3 IMPLANT
TAPE MICROFOAM 4IN (TAPE) ×3 IMPLANT
TUBING CONNECTING 10 (TUBING) ×2 IMPLANT
TUBING CONNECTING 10' (TUBING) ×1
TUBING INFLOW SET DBFLO PUMP (TUBING) ×3 IMPLANT
TUBING OUTFLOW SET DBLFO PUMP (TUBING) ×3 IMPLANT
WAND WEREWOLF FLOW 90D (MISCELLANEOUS) ×3 IMPLANT
WATER STERILE IRR 500ML POUR (IV SOLUTION) IMPLANT
WRAPON POLAR PAD SHDR XLG (MISCELLANEOUS) ×3

## 2021-01-17 NOTE — Anesthesia Procedure Notes (Signed)
Procedure Name: Intubation Date/Time: 01/17/2021 11:12 AM Performed by: Rodney Booze, CRNA Pre-anesthesia Checklist: Patient identified, Emergency Drugs available, Suction available and Patient being monitored Patient Re-evaluated:Patient Re-evaluated prior to induction Oxygen Delivery Method: Circle system utilized Preoxygenation: Pre-oxygenation with 100% oxygen Induction Type: IV induction Ventilation: Mask ventilation without difficulty Laryngoscope Size: McGraph and 3 Grade View: Grade I Tube type: Oral Tube size: 7.0 mm Number of attempts: 1 Airway Equipment and Method: Stylet and Oral airway Placement Confirmation: ETT inserted through vocal cords under direct vision, positive ETCO2 and breath sounds checked- equal and bilateral Secured at: 19 cm Tube secured with: Tape Dental Injury: Teeth and Oropharynx as per pre-operative assessment

## 2021-01-17 NOTE — Op Note (Signed)
01/17/2021  1:40 PM  PATIENT:  Judy Burnett  55 y.o. female  PRE-OPERATIVE DIAGNOSIS:  Left Shoulder igh-grade partial-thickness rotator cuff tear, subacromial impingement and acromioclavicular arthrosis  POST-OPERATIVE DIAGNOSIS: Same  PROCEDURE: Left shoulder arthroscopic subacromial decompression, distal clavicle excision and mini open rotator cuff repair  SURGEON:  Surgeon(s) and Role:    Thornton Park, MD - Primary  ANESTHESIA:   general and paracervical block   PREOPERATIVE INDICATIONS:  Judy Burnett is a  55 y.o. female with a diagnosis of persistent left shoulder pain with high-grade partial thickness rotator cuff tear confirmed by MRI.  Patient has failed conservative treatment and elected for surgical management.  Patient has previously under gone successful right shoulder rotator cuff repair by me in the past.  The risks benefits and alternatives were discussed with the patient preoperatively including but not limited to the risks of infection, bleeding, nerve injury, persistent pain or weakness, shoulder stiffness/arthrofibrosis, failure of the repair, re-tear of the rotator cuff and the need for further surgery. Medical risks include DVT and pulmonary embolism, myocardial infarction, stroke, pneumonia, respiratory failure and death. Patient understood these risks and wished to proceed.  OPERATIVE IMPLANTS: [place] MultiFix anchor x 1 & [employer] Q Fix anchors x 1  OPERATIVE FINDINGS: High-grade partial-thickness tear involving the supra spinatus  OPERATIVE PROCEDURE: The patient was met in the preoperative area. The left shoulder was signed with the word yes and my initials according the hospital's correct site of surgery protocol.   A pre-op history and physical was performed at the bedside.   Patient underwent an interscalene block with Exparel by the anesthesia service.  Patient was brought to the operating room where she underwent general anesthesia.  The  patient was placed in a beachchair position.  A spider arm positioner was used for this case.  Examination under anesthesia revealed no loss of passive range of motion or instability with load shift testing. The patient had a negative sulcus sign.  Patient was prepped and draped in a sterile fashion. A timeout was performed to verify the patient's name, date of birth, medical record number, correct site of surgery and correct procedure to be performed there was also used to verify the patient received antibiotics that all appropriate instruments, implants and radiographs studies were available in the room. Once all in attendance were in agreement case began.  Patient received ancef 2 grams IV for pre-op antibiotics.  Bony landmarks were drawn out with a surgical marker along with proposed arthroscopy incisions.  An 11 blade was used to establish a posterior portal through which the arthroscope was placed in the glenohumeral joint. A full diagnostic examination of the shoulder was performed.    The arthroscope was then placed in the subacromial space.  Extensive bursitis was encountered and debrided using a 4-0 resector shaver blade and a 90 ArthroCare wand from a lateral portal which was established under direct visualization using an 18-gauge spinal needle. A subacromial decompression was performed sing a 5.5 mm resector shaver blade from the lateral portal.   A distal clavicle excision was then performed through the anterior portal also using the 5.5 mm resector shaver blade.  A 5.5 mm resector shaver blade was then used to debride the greater tuberosity of all torn fibers of the rotator cuff.  Two perfect Pass sutures were placed in the lateral border of the rotator cuff tear. All arthroscopic instruments were then removed and the mini-open portion of the procedure  began.  A saber-type incision was made along the lateral border of the acromion. The deltoid muscle was identified and split in line with its  fibers which allowed visualization of the rotator cuff. The Perfect Pass sutures previously placed in the lateral border of the rotator cuff were brought out through the deltoid split.  A single Smith and [employer] Fix anchors were placed at the articular margin of the humeral Burnett with the greater tuberosity. The suture limbs of the Q Fix anchors were passed medially through the rotator cuff using a First Pass suture passer. The two Perfect Pass sutures were then anchored to the greater tuberosity footprint using a single [employer] Multifix anchor. The sutures passed through the Multifix anchors were then tensioned to allow reduction of the rotator cuff to the greater tuberosity footprint. The medial row repair was then performed using the Q fix sutures, which were tied down using an arthroscopic knot tying technique.  Arthroscopic images of the repair were taken with the arthroscope both externally and from inside the glenohumeral joint.  All incisions were copiously irrigated. The deltoid fascia was repaired using a 0 Vicryl suture.  The subcutaneous tissue of all incisions were closed with a 2-0 Vicryl. Skin closure for the arthroscopic incisions was performed with 4-0 nylon. The skin edges of the saber incision was approximated with a running 4-0 undyed Monocryl.  A dry sterile dressing was applied.  The patient was placed in an abduction sling and a Polar Care was applied to the shoulder.  All sharp, sponge and it instrument counts were correct at the conclusion of the case. I was scrubbed and present for the entire case. I spoke with the patient's husband postoperatively to let him know the case had been performed without complication and the patient was stable in recovery room.

## 2021-01-17 NOTE — Discharge Instructions (Addendum)
AMBULATORY SURGERY  DISCHARGE INSTRUCTIONS   The drugs that you were given will stay in your system until tomorrow so for the next 24 hours you should not:  Drive an automobile Make any legal decisions Drink any alcoholic beverage   You may resume regular meals tomorrow.  Today it is better to start with liquids and gradually work up to solid foods.  You may eat anything you prefer, but it is better to start with liquids, then soup and crackers, and gradually work up to solid foods.   Please notify your doctor immediately if you have any unusual bleeding, trouble breathing, redness and pain at the surgery site, drainage, fever, or pain not relieved by medication.    Additional Instructions:        Please contact your physician with any problems or Same Day Surgery at [phone], Monday through Friday 6 am to 4 pm, or [place] [facility] Main number at [phone]. POLAR CARE INFORMATION  [email or web address]  How to use [employer] Polar Care Glacier Cold Therapy System?  YouTube   https://www.youtube.com/watch?v=E5pJtyfj4co  OPERATING INSTRUCTIONS  Start the product With dry hands, connect the transformer to the electrical connection located on the top of the cooler. Next, plug the transformer into an appropriate electrical outlet. The unit will automatically start running at this point.  To stop the pump, disconnect electrical power.  Unplug to stop the product when not in use. Unplugging the Polar Care unit turns it off. Always unplug immediately after use. Never leave it plugged in while unattended. Remove pad.    FIRST ADD WATER TO FILL LINE, THEN ICE---Replace ice when existing ice is almost melted  1 Discuss Treatment with your Licensed Health Care Practitioner and Use Only as Prescribed 2 Apply Insulation Barrier & Cold Therapy Pad 3 Check for Moisture 4 Inspect Skin Regularly  Tips and Trouble Shooting Usage Tips 1. Use cubed or chunked ice for optimal  performance. 2. It is recommended to drain the Pad between uses. To drain the pad, hold the Pad upright with the hose pointed toward the ground. Depress the black plunger and allow water to drain out. 3. You may disconnect the Pad from the unit without removing the pad from the affected area by depressing the silver tabs on the hose coupling and gently pulling the hoses apart. The Pad and unit will seal itself and will not leak. Note: Some dripping during release is normal. 4. DO NOT RUN PUMP WITHOUT WATER! The pump in this unit is designed to run with water. Running the unit without water will cause permanent damage to the pump. 5. Unplug unit before removing lid.  TROUBLESHOOTING GUIDE Pump not running, Water not flowing to the pad, Pad is not getting cold 1. Make sure the transformer is plugged into the wall outlet. 2. Confirm that the ice and water are filled to the indicated levels. 3. Make sure there are no kinks in the pad. 4. Gently pull on the blue tube to make sure the tube/pad junction is straight. 5. Remove the pad from the treatment site and ll it while the pad is lying at; then reapply. 6. Confirm that the pad couplings are securely attached to the unit. Listen for the double clicks (Figure 1) to confirm the pad couplings are securely attached.  Leaks    Note: Some condensation on the lines, controller, and pads is unavoidable, especially in warmer climates. 1. If using a [employer] Polar Care Cold Therapy unit with a detachable Cold Therapy   Pad, and a leak exists (other than condensation on the lines) disconnect the pad couplings. Make sure the silver tabs on the couplings are depressed before reconnecting the pad to the pump hose; then confirm both sides of the coupling are properly clicked in. 2. If the coupling continues to leak or a leak is detected in the pad itself, stop using it and call [employer] Customer Care at [phone].  Cleaning After use, empty and dry the unit with a soft  cloth. Warm water and mild detergent may be used occasionally to clean the pump and tubes.  WARNING: The Polar Care Cube can be cold enough to cause serious injury, including full skin necrosis. Follow these Operating Instructions, and carefully read the Product Insert (see pouch on side of unit) and the Cold Therapy Pad Fitting Instructions (provided with each Cold Therapy Pad) prior to use.        SHOULDER SLING IMMOBILIZER   VIDEO Slingshot 2 Shoulder Brace Application - YouTube ---https://www.youtube.com/watch?v=RIUeiJxalMg  INSTRUCTIONS While supporting the injured arm, slide the forearm into the sling. Wrap the adjustable shoulder strap around the neck and shoulders and attach the strap end to the sling using  the "alligator strap tab."  Adjust the shoulder strap to the required length. Position the shoulder pad behind the neck. To secure the shoulder pad location (optional), pull the shoulder strap away from the shoulder pad, unfold the hook material on the top of the pad, then press the shoulder strap back onto the hook material to secure the pad in place. Attach the closure strap across the open top of the sling. Position the strap so that it holds the arm securely in the sling. Next, attach the thumb strap to the open end of the sling between the thumb and fingers. After sling has been fit, it may be easily removed and reapplied using the quick release buckle on shoulder strap. If a neutral pillow or 15 abduction pillow is included, place the pillow at the waistline. Attach the sling to the pillow, lining up hook material on the pillow with the loop on sling. Adjust the waist strap to fit.  If waist strap is too long, cut it to fit. Use the small piece of double sided hook material (located on top of the pillow) to secure the strap end. Place the double sided hook material on the inside of the cut strap end and secure it to the waist strap.     If no pillow is included, attach the  waist strap to the sling and adjust to fit.    Washing Instructions: Straps and sling must be removed and cleaned regularly depending on your activity level and perspiration. Hand wash straps and sling in cold water with mild detergent, rinse, air dry     

## 2021-01-17 NOTE — Transfer of Care (Signed)
Immediate Anesthesia Transfer of Care Note  Patient: Judy Burnett  Procedure(s) Performed: SHOULDER ARTHROSCOPY WITH OPEN ROTATOR CUFF REPAIR AND DISTAL CLAVICLE ACROMINECTOMY; SUBACROMIAL DECOMPRESSION AND DISTAL CLAVICLE EXCISION (Left)  Patient Location: PACU  Anesthesia Type:General  Level of Consciousness: awake and drowsy  Airway & Oxygen Therapy: Patient Spontanous Breathing and Patient connected to face mask oxygen  Post-op Assessment: Report given to RN and Post -op Vital signs reviewed and stable  Post vital signs: stable  Last Vitals:  Vitals Value Taken Time  BP 129/76 01/17/21 1330  Temp    Pulse 78 01/17/21 1332  Resp 11 01/17/21 1332  SpO2 98 % 01/17/21 1332  Vitals shown include unvalidated device data.  Last Pain:  Vitals:   01/17/21 0921  TempSrc: Temporal  PainSc: 0-No pain         Complications: No notable events documented.

## 2021-01-17 NOTE — Anesthesia Procedure Notes (Signed)
Anesthesia Regional Block: Interscalene brachial plexus block   Pre-Anesthetic Checklist: , timeout performed,  Correct Patient, Correct Site, Correct Laterality,  Correct Procedure, Correct Position, site marked,  Risks and benefits discussed,  Surgical consent,  Pre-op evaluation,  At surgeon's request and post-op pain management  Laterality: Left  Prep: Maximum Sterile Barrier Precautions used, chloraprep       Needles:  Injection technique: Single-shot  Needle Type: Stimulator Needle - 40     Needle Length: 9cm  Needle Gauge: 22   Needle insertion depth: 3 cm   Additional Needles:   Procedures:, nerve stimulator,,, ultrasound used (permanent image in chart),,   Motor weakness within 10 minutes.   Nerve Stimulator or Paresthesia:  Response: Bicep, 0.05 mA, 3 cm  Additional Responses:   Narrative:  Start time: 01/17/2021 10:18 AM End time: 01/17/2021 10:28 AM Injection made incrementally with aspirations every 5 mL.  Performed by: Personally   Additional Notes: Patient tolerated procedure well.  Challenging ultrasound imaging due to body habitus. 23ml Exparel + 18ml 0.5% Ropivacaine

## 2021-01-17 NOTE — Anesthesia Postprocedure Evaluation (Signed)
Anesthesia Post Note  Patient: Judy Burnett  Procedure(s) Performed: SHOULDER ARTHROSCOPY WITH OPEN ROTATOR CUFF REPAIR AND DISTAL CLAVICLE ACROMINECTOMY; SUBACROMIAL DECOMPRESSION AND DISTAL CLAVICLE EXCISION (Left)  Patient location during evaluation: PACU Anesthesia Type: General Level of consciousness: awake and alert Pain management: pain level controlled Vital Signs Assessment: post-procedure vital signs reviewed and stable Respiratory status: spontaneous breathing, nonlabored ventilation, respiratory function stable and patient connected to nasal cannula oxygen Cardiovascular status: blood pressure returned to baseline and stable Postop Assessment: no apparent nausea or vomiting Anesthetic complications: no   No notable events documented.   Last Vitals:  Vitals:   01/17/21 1435 01/17/21 1444  BP: 113/71 117/74  Pulse: 67 82  Resp: 17 20  Temp:  (!) 36.3 C  SpO2: 94% 94%    Last Pain:  Vitals:   01/17/21 1444  TempSrc: Temporal  PainSc: 0-No pain                 Johny Blamer

## 2021-01-17 NOTE — Anesthesia Preprocedure Evaluation (Addendum)
Anesthesia Evaluation  Patient identified by MRN, date of birth, ID band Patient awake    Reviewed: Allergy & Precautions, NPO status , Patient's Chart, lab work & pertinent test results  Airway Mallampati: III  TM Distance: >3 FB Neck ROM: full    Dental   Pulmonary neg pulmonary ROS,    Pulmonary exam normal        Cardiovascular hypertension, negative cardio ROS Normal cardiovascular exam     Neuro/Psych negative neurological ROS  negative psych ROS   GI/Hepatic negative GI ROS, Neg liver ROS,   Endo/Other  negative endocrine ROS  Renal/GU      Musculoskeletal   Abdominal   Peds  Hematology negative hematology ROS (+)   Anesthesia Other Findings Past Medical History: No date: Anxiety 11/2015: Bronchitis No date: Central obesity 2007: CHF (congestive heart failure) (HCC)     Comment:  ? history No date: Complication of anesthesia No date: Depression No date: Diarrhea No date: Dyspnea No date: GERD (gastroesophageal reflux disease) No date: Hyperlipidemia No date: Hypertension No date: Hypothyroidism No date: Pre-diabetes No date: Sleep disorder No date: Sleep disorder  Past Surgical History: No date: ABDOMINAL HYSTERECTOMY     Comment:  BIL S&O No date: APPENDECTOMY 01/2008: BREAST BIOPSY; Left     Comment:  neg 12/30/2015: COLONOSCOPY WITH PROPOFOL; N/A     Comment:  Procedure: COLONOSCOPY WITH PROPOFOL;  Surgeon: Scot Jun, MD;  Location: [facility] ENDOSCOPY;  Service:               Endoscopy;  Laterality: N/A; 3/209: DERMOID CYST REMOVAL 02/14/2018: ESOPHAGOGASTRODUODENOSCOPY (EGD) WITH PROPOFOL; N/A     Comment:  Procedure: ESOPHAGOGASTRODUODENOSCOPY (EGD) WITH               PROPOFOL;  Surgeon: Scot Jun, MD;  Location:               [facility] ENDOSCOPY;  Service: Endoscopy;  Laterality: N/A; 01/19/2016: SHOULDER ARTHROSCOPY WITH OPEN ROTATOR CUFF REPAIR; Right      Comment:  Procedure: SHOULDER ARTHROSCOPY WITH OPEN ROTATOR CUFF               REPAIR;  Surgeon: Juanell Fairly, MD;  Location: [facility]               ORS;  Service: Orthopedics;  Laterality: Right;  BMI    Body Mass Index: 33.66 kg/m      Reproductive/Obstetrics negative OB ROS                            Anesthesia Physical Anesthesia Plan  ASA: 3  Anesthesia Plan: General ETT   Post-op Pain Management:    Induction:   PONV Risk Score and Plan: Ondansetron, Dexamethasone, Midazolam and Treatment may vary due to age or medical condition  Airway Management Planned:   Additional Equipment:   Intra-op Plan:   Post-operative Plan:   Informed Consent: I have reviewed the patients History and Physical, chart, labs and discussed the procedure including the risks, benefits and alternatives for the proposed anesthesia with the patient or authorized representative who has indicated his/her understanding and acceptance.     Dental Advisory Given  Plan Discussed with: Anesthesiologist, CRNA and Surgeon  Anesthesia Plan Comments: (Spoke at length about interscalene nerve block.  She reports she was short of breath at home after an ISB on the right  side previously.  Risks and benefits were thoroughly discussed and she wishes to proceed with the block at this time.  )       Anesthesia Quick Evaluation

## 2021-01-17 NOTE — H&P (Signed)
PREOPERATIVE H&P  Chief Complaint: Left Shoulder partial-thickness rotator Cuff Tear  HPI: Judy Burnett is a 55 y.o. female who presents for preoperative history and physical with a diagnosis of partial-thickness left Shoulder Rotator Cuff Tear.  Persistent left shoulder pain is significantly impairing activities of daily living.  Patient is failed nonoperative management wished to proceed with arthroscopic evaluation of her left shoulder with subacromial decompression, distal clavicle excision and possible mini open rotator cuff repair.  Patient has undergone a previous successful right shoulder rotator cuff repair by me.  Past Medical History:  Diagnosis Date   Anxiety    Bronchitis 11/2015   Central obesity    CHF (congestive heart failure) (HCC) 2007   ? history   Complication of anesthesia    Depression    Diarrhea    Dyspnea    GERD (gastroesophageal reflux disease)    Hyperlipidemia    Hypertension    Hypothyroidism    Pre-diabetes    Sleep disorder    Sleep disorder    Past Surgical History:  Procedure Laterality Date   ABDOMINAL HYSTERECTOMY     BIL S&O   APPENDECTOMY     BREAST BIOPSY Left 01/2008   neg   COLONOSCOPY WITH PROPOFOL N/A 12/30/2015   Procedure: COLONOSCOPY WITH PROPOFOL;  Surgeon: Scot Jun, MD;  Location: [facility] ENDOSCOPY;  Service: Endoscopy;  Laterality: N/A;   DERMOID CYST REMOVAL  3/209   ESOPHAGOGASTRODUODENOSCOPY (EGD) WITH PROPOFOL N/A 02/14/2018   Procedure: ESOPHAGOGASTRODUODENOSCOPY (EGD) WITH PROPOFOL;  Surgeon: Scot Jun, MD;  Location: [facility] ENDOSCOPY;  Service: Endoscopy;  Laterality: N/A;   SHOULDER ARTHROSCOPY WITH OPEN ROTATOR CUFF REPAIR Right 01/19/2016   Procedure: SHOULDER ARTHROSCOPY WITH OPEN ROTATOR CUFF REPAIR;  Surgeon: Juanell Fairly, MD;  Location: [facility] ORS;  Service: Orthopedics;  Laterality: Right;   Social History   Socioeconomic History   Marital status: Married    Spouse name: Not on file   Number of  children: Not on file   Years of education: Not on file   Highest education level: Not on file  Occupational History   Not on file  Tobacco Use   Smoking status: Never   Smokeless tobacco: Never  Vaping Use   Vaping Use: Never used  Substance and Sexual Activity   Alcohol use: No   Drug use: No   Sexual activity: Not on file  Other Topics Concern   Not on file  Social History Narrative   Not on file   Social Determinants of Health   Financial Resource Strain: Not on file  Food Insecurity: Not on file  Transportation Needs: Not on file  Physical Activity: Not on file  Stress: Not on file  Social Connections: Not on file   Family History  Problem Relation Age of Onset   Breast cancer Neg Hx    No Known Allergies Prior to Admission medications   Medication Sig Start Date End Date Taking? Authorizing Provider  albuterol (PROVENTIL HFA;VENTOLIN HFA) 108 (90 Base) MCG/ACT inhaler Inhale into the lungs every 6 (six) hours as needed for wheezing or shortness of breath.   Yes [provider]  ALPRAZolam Prudy Feeler) 0.5 MG tablet Take 0.5 mg by mouth at bedtime as needed for anxiety or sleep.   Yes [provider]  buPROPion ER (WELLBUTRIN SR) 100 MG 12 hr tablet Take 100 mg by mouth daily. Patient not taking: Reported on 12/30/2020   Yes [provider]  escitalopram (LEXAPRO) 20 MG tablet Take  20 mg by mouth daily.   Yes [provider]  hydrochlorothiazide (HYDRODIURIL) 25 MG tablet Take 25 mg by mouth every morning.    Yes [provider]  levothyroxine (SYNTHROID) 75 MCG tablet Take 75 mcg by mouth daily before breakfast.   Yes [provider]  Multiple Vitamins-Minerals (MULTIVITAMIN PO) Take 1 tablet by mouth at bedtime.   Yes [provider]  omeprazole (PRILOSEC) 20 MG capsule Take 20 mg by mouth daily as needed (indigestion).   Yes [provider]  rosuvastatin (CRESTOR) 20 MG tablet Take 20 mg by mouth at  bedtime.   Yes [provider]     Positive ROS: All other systems have been reviewed and were otherwise negative with the exception of those mentioned in the HPI and as above.  Physical Exam: General: Alert, no acute distress Cardiovascular: Regular rate and rhythm, no murmurs rubs or gallops.  No pedal edema Respiratory: Clear to auscultation bilaterally, no wheezes rales or rhonchi. No cyanosis, no use of accessory musculature GI: No organomegaly, abdomen is soft and non-tender nondistended with positive bowel sounds. Skin: Skin intact, no lesions within the operative field. Neurologic: Sensation intact distally Psychiatric: Patient is competent for consent with normal mood and affect Lymphatic: No cervical lymphadenopathy  MUSCULOSKELETAL: Left shoulder:  The patient can forward elevate and abduct to approximately 150 degrees and 140 degrees respectively, but has pain in the mid range of abduction. She did not demonstrate obvious weakness of the rotator cuff to manual strength testing today. She had full digital, wrist and elbow range of motion, intact sensation to light touch and a palpable radial pulse. She has pain with impingement testing, but no apprehension or instability.   Radiology: The patient has moderate tendinosis of the supraspinatus tendon. She has bursal-sided fraying versus partial tear of the supraspinatus. She has moderate bursitis and evidence of a muscle strain. The patient had evidence of a small calcium deposit near the infraspinatus insertion. There is a slit-like interstitial laminar tear of the subscapularis as well. The patient has an intact long head of the biceps tendon. She has intact labrum and no soft tissue mass. The patient had moderate AC joint arthropathy with marrow reaction of the distal clavicle and acromion.   Assessment: Left Shoulder Rotator Cuff Tear  Plan: Plan for Procedure(s): SHOULDER ARTHROSCOPY WITH OPEN ROTATOR CUFF REPAIR AND  DISTAL CLAVICLE ACROMINECTOMY; SUBACROMIAL DECOMPRESSION AND DISTAL CLAVICLE EXCISION  I reviewed the details of the operation as well as the postoperative course with the patient.  I performed a preop history and physical at the bedside.  I reviewed the patient's MRI in preparation for this case.  Patient's pain may be all related to impingement and AC joint arthrosis, however careful examination of the rotator cuff will be performed and the rotator cuff will be repaired if necessary.  I discussed the risks and benefits of surgery. The risks include but are not limited to infection, bleeding  nerve or blood vessel injury, joint stiffness or loss of motion, persistent pain, weakness or instability, retear of the rotator cuff or hardware failure and the need for further surgery. Medical risks include but are not limited to DVT and pulmonary embolism, myocardial infarction, stroke, pneumonia, respiratory failure and death. Patient understood these risks and wished to proceed.     Juanell Fairly, MD   01/17/2021 11:03 AM

## 2021-01-18 ENCOUNTER — Encounter: Payer: Self-pay | Admitting: Orthopedic Surgery

## 2021-11-08 ENCOUNTER — Other Ambulatory Visit: Payer: Self-pay | Admitting: Nurse Practitioner

## 2021-11-08 DIAGNOSIS — Z1231 Encounter for screening mammogram for malignant neoplasm of breast: Secondary | ICD-10-CM

## 2021-12-14 ENCOUNTER — Ambulatory Visit
Admission: RE | Admit: 2021-12-14 | Discharge: 2021-12-14 | Disposition: A | Discharge status: Home / Self Care | Payer: BC Managed Care – PPO | Source: Ambulatory Visit | Attending: Nurse Practitioner | Admitting: Nurse Practitioner

## 2021-12-14 DIAGNOSIS — Z1231 Encounter for screening mammogram for malignant neoplasm of breast: Secondary | ICD-10-CM | POA: Insufficient documentation

## 2021-12-20 ENCOUNTER — Other Ambulatory Visit: Payer: Self-pay | Admitting: Nurse Practitioner

## 2021-12-20 DIAGNOSIS — N644 Mastodynia: Secondary | ICD-10-CM

## 2022-01-09 ENCOUNTER — Ambulatory Visit
Admission: RE | Admit: 2022-01-09 | Discharge: 2022-01-09 | Disposition: A | Discharge status: Home / Self Care | Payer: BC Managed Care – PPO | Source: Ambulatory Visit | Attending: Nurse Practitioner | Admitting: Nurse Practitioner

## 2022-01-09 DIAGNOSIS — N644 Mastodynia: Secondary | ICD-10-CM | POA: Insufficient documentation

## 2023-01-21 ENCOUNTER — Other Ambulatory Visit: Payer: Self-pay | Admitting: Nurse Practitioner

## 2023-01-21 DIAGNOSIS — Z1231 Encounter for screening mammogram for malignant neoplasm of breast: Secondary | ICD-10-CM

## 2023-02-12 ENCOUNTER — Ambulatory Visit
Admission: RE | Admit: 2023-02-12 | Discharge: 2023-02-12 | Disposition: A | Discharge status: Home / Self Care | Payer: BC Managed Care – PPO | Source: Ambulatory Visit | Attending: Nurse Practitioner | Admitting: Nurse Practitioner

## 2023-02-12 DIAGNOSIS — Z1231 Encounter for screening mammogram for malignant neoplasm of breast: Secondary | ICD-10-CM | POA: Diagnosis present

## 2024-01-08 ENCOUNTER — Emergency Department
Admission: EM | Admit: 2024-01-08 | Discharge: 2024-01-08 | Disposition: A | Discharge status: Home / Self Care | Attending: Emergency Medicine | Admitting: Emergency Medicine

## 2024-01-08 ENCOUNTER — Encounter: Payer: Self-pay | Admitting: Emergency Medicine

## 2024-01-08 ENCOUNTER — Other Ambulatory Visit: Payer: Self-pay

## 2024-01-08 ENCOUNTER — Emergency Department

## 2024-01-08 DIAGNOSIS — R202 Paresthesia of skin: Secondary | ICD-10-CM | POA: Diagnosis not present

## 2024-01-08 DIAGNOSIS — R200 Anesthesia of skin: Secondary | ICD-10-CM | POA: Diagnosis present

## 2024-01-08 DIAGNOSIS — I10 Essential (primary) hypertension: Secondary | ICD-10-CM | POA: Diagnosis not present

## 2024-01-08 LAB — CBC
HCT: 40.1 % (ref 36.0–46.0)
Hemoglobin: 13.3 g/dL (ref 12.0–15.0)
MCH: 28.2 pg (ref 26.0–34.0)
MCHC: 33.2 g/dL (ref 30.0–36.0)
MCV: 85 fL (ref 80.0–100.0)
Platelets: 275 K/uL (ref 150–400)
RBC: 4.72 MIL/uL (ref 3.87–5.11)
RDW: 12.9 % (ref 11.5–15.5)
WBC: 10.6 K/uL — ABNORMAL HIGH (ref 4.0–10.5)
nRBC: 0 % (ref 0.0–0.2)

## 2024-01-08 LAB — COMPREHENSIVE METABOLIC PANEL WITH GFR
ALT: 50 U/L — ABNORMAL HIGH (ref 0–44)
AST: 49 U/L — ABNORMAL HIGH (ref 15–41)
Albumin: 3.9 g/dL (ref 3.5–5.0)
Alkaline Phosphatase: 63 U/L (ref 38–126)
Anion gap: 10 (ref 5–15)
BUN: 10 mg/dL (ref 6–20)
CO2: 24 mmol/L (ref 22–32)
Calcium: 9.9 mg/dL (ref 8.9–10.3)
Chloride: 105 mmol/L (ref 98–111)
Creatinine, Ser: 0.46 mg/dL (ref 0.44–1.00)
GFR, Estimated: >60 mL/min (ref >60)
Glucose, Bld: 111 mg/dL — ABNORMAL HIGH (ref 70–99)
Potassium: 3.5 mmol/L (ref 3.5–5.1)
Sodium: 139 mmol/L (ref 135–145)
Total Bilirubin: 0.3 mg/dL (ref 0.0–1.2)
Total Protein: 7.2 g/dL (ref 6.5–8.1)

## 2024-01-08 MED ORDER — DIAZEPAM 5 MG PO TABS
5.0000 mg | ORAL_TABLET | Freq: Once | ORAL | Status: AC
  Administered 2024-01-08: 5 mg via ORAL
  Filled 2024-01-08: qty 1

## 2024-01-08 NOTE — Discharge Instructions (Addendum)
 Please call the number provided for neurosurgery to arrange a follow-up appointment given your spinal stenosis seen on your MRI of the neck.  Return to the emergency department for any worsening weakness or numbness of any arm or leg confusion slurred speech or any other symptom personally concerning to yourself.

## 2024-01-08 NOTE — ED Triage Notes (Signed)
 Patient to ED via POV for left sided numbness. States intermittent and had 3 episodes over the last 2 weeks with the last episode occurring yesterday. Denies any symptoms at this time. Speech clear and pt ambulatory without assistance.

## 2024-01-08 NOTE — ED Provider Notes (Signed)
 [facility] Provider Note    Event Date/Time   First MD Initiated Contact with Patient 01/08/24 [phone]     (approximate)  History   Chief Complaint: Numbness  HPI  Judy Burnett is a 58 y.o. female with a past medical history of anxiety, depression, gastric reflux, hypertension, hyperlipidemia, presents to the emergency department for intermittent numbness/tingling sensation to the left face and left arm.  According to the patient over the last 2 weeks she has had 3 episodes now each lasting 1 to 2 hours and when she experiences numbness and tingling of her left face and left arm.  Patient states an episode this morning largely resolved however continues to have a numbness sensation to her left upper and lower lip.  Patient states at times she will feel a numbness or tingling in her left leg as well but did not occur this morning.  No history of CVA.  Patient states she is very worried that it could be something going on in her head.  Admits to history of anxiety.  Physical Exam   Triage Vital Signs: ED Triage Vitals  Encounter Vitals Group     BP 01/08/24 0934 137/67     Girls Systolic BP Percentile --      Girls Diastolic BP Percentile --      Boys Systolic BP Percentile --      Boys Diastolic BP Percentile --      Pulse Rate 01/08/24 0934 80     Resp 01/08/24 0934 18     Temp 01/08/24 0934 98 F (36.7 C)     Temp Source 01/08/24 0934 Oral     SpO2 01/08/24 0934 99 %     Weight 01/08/24 0933 190 lb (86.2 kg)     Height 01/08/24 0933 5' 4 (1.626 m)     Head Circumference --      Peak Flow --      Pain Score 01/08/24 0933 0     Pain Loc --      Pain Education --      Exclude from Growth Chart --     Most recent vital signs: Vitals:   01/08/24 0934  BP: 137/67  Pulse: 80  Resp: 18  Temp: 98 F (36.7 C)  SpO2: 99%    General: Awake, no distress.  CV:  Good peripheral perfusion.  Regular rate and rhythm  Resp:  Normal effort.  Equal breath  sounds bilaterally.  Abd:  No distention.  Soft, nontender.  No rebound or guarding. Other:  Equal grip strength bilaterally.  No pronator drift.  5/5 motor in all extremities.  Equal sensation in face arm and legs although patient does state a tingling sensation in her upper lip on the left side currently  ED Results / Procedures / Treatments   EKG  EKG viewed and interpreted by myself shows a normal sinus rhythm 82 bpm with a narrow QRS, normal axis, normal intervals, no concerning ST changes.  RADIOLOGY  MRI of the brain is negative. MRI of the cervical spine shows some moderate spinal stenosis of C5/C6.   MEDICATIONS ORDERED IN ED: Medications - No data to display   IMPRESSION / MDM / ASSESSMENT AND PLAN / ED COURSE  I reviewed the triage vital signs and the nursing notes.  Patient's presentation is most consistent with acute presentation with potential threat to life or bodily function.  Patient presents emergency department for intermittent symptoms of paresthesias and numbness sensation  to her left face arm and at times left leg.  Symptoms have been occurring over the last 2 weeks she has now had 3 episodes lasting 1 to 2 hours each.  Continues to have slight symptoms and left face right now per patient.  Reassuring neurological exam with no objective findings.  Will check labs given the patient's symptoms with no history of CVA we will obtain MRI of the brain and C-spine as a precaution.  Overall patient appears well with a reassuring physical exam reassuring neurological exam reassuring vital signs.  Lab work and MRI is pending.  MRI brain is negative.  MRI cervical spine does show moderate spinal stenosis but no abnormal spinal cord signal throughout.  Will have the patient follow-up with neurosurgery for further evaluation.  Given her reassuring workup in the emergency department including normal CBC reassuring chemistry and reassuring MRI of the brain and cervical spine I  believe the patient safe for discharge home with outpatient follow-up.  Patient agreeable to plan of care.  FINAL CLINICAL IMPRESSION(S) / ED DIAGNOSES   Paresthesias    Note:  This document was prepared using Dragon voice recognition software and may include unintentional dictation errors.   Dorothyann Drivers, MD 01/08/24 1504

## 2024-02-12 ENCOUNTER — Other Ambulatory Visit: Payer: Self-pay | Admitting: Nurse Practitioner

## 2024-02-12 DIAGNOSIS — Z1231 Encounter for screening mammogram for malignant neoplasm of breast: Secondary | ICD-10-CM

## 2024-02-13 ENCOUNTER — Encounter

## 2024-03-30 ENCOUNTER — Encounter

## 2024-04-08 ENCOUNTER — Ambulatory Visit
Admission: RE | Admit: 2024-04-08 | Discharge: 2024-04-08 | Disposition: A | Source: Ambulatory Visit | Attending: Nurse Practitioner | Admitting: Nurse Practitioner

## 2024-04-08 DIAGNOSIS — Z1231 Encounter for screening mammogram for malignant neoplasm of breast: Secondary | ICD-10-CM
